# Patient Record
Sex: Female | Born: 1960 | Race: Black or African American | Hispanic: No | Marital: Married | Smoking: Never smoker
Health system: Southern US, Community
[De-identification: ages and names within clinical notes are randomized; demographics above are authoritative.]

## PROBLEM LIST (undated history)

## (undated) DIAGNOSIS — L97219 Non-pressure chronic ulcer of right calf with unspecified severity: Secondary | ICD-10-CM

## (undated) DIAGNOSIS — R079 Chest pain, unspecified: Secondary | ICD-10-CM

## (undated) DIAGNOSIS — E669 Obesity, unspecified: Secondary | ICD-10-CM

## (undated) DIAGNOSIS — I2510 Atherosclerotic heart disease of native coronary artery without angina pectoris: Secondary | ICD-10-CM

## (undated) DIAGNOSIS — R42 Dizziness and giddiness: Secondary | ICD-10-CM

## (undated) DIAGNOSIS — D751 Secondary polycythemia: Secondary | ICD-10-CM

## (undated) DIAGNOSIS — I10 Essential (primary) hypertension: Secondary | ICD-10-CM

## (undated) DIAGNOSIS — R0602 Shortness of breath: Secondary | ICD-10-CM

## (undated) DIAGNOSIS — R531 Weakness: Secondary | ICD-10-CM

## (undated) DIAGNOSIS — N6099 Unspecified benign mammary dysplasia of unspecified breast: Secondary | ICD-10-CM

## (undated) DIAGNOSIS — E119 Type 2 diabetes mellitus without complications: Secondary | ICD-10-CM

## (undated) DIAGNOSIS — E785 Hyperlipidemia, unspecified: Secondary | ICD-10-CM

## (undated) DIAGNOSIS — N183 Chronic kidney disease, stage 3 unspecified: Secondary | ICD-10-CM

## (undated) DIAGNOSIS — R000 Tachycardia, unspecified: Secondary | ICD-10-CM

## (undated) HISTORY — DX: Hyperlipidemia, unspecified: E78.5

## (undated) HISTORY — DX: Dizziness and giddiness: R42

## (undated) HISTORY — DX: Unspecified benign mammary dysplasia of unspecified breast: N60.99

## (undated) HISTORY — DX: Chest pain, unspecified: R07.9

## (undated) HISTORY — DX: Atherosclerotic heart disease of native coronary artery without angina pectoris: I25.10

## (undated) HISTORY — DX: Type 2 diabetes mellitus without complications: E11.9

## (undated) HISTORY — DX: Non-pressure chronic ulcer of right calf with unspecified severity: L97.219

## (undated) HISTORY — DX: Weakness: R53.1

## (undated) HISTORY — PX: ABDOMINAL HYSTERECTOMY: SHX81

## (undated) HISTORY — DX: Secondary polycythemia: D75.1

## (undated) HISTORY — DX: Chronic kidney disease, stage 3 unspecified: N18.30

## (undated) HISTORY — DX: Tachycardia, unspecified: R00.0

## (undated) HISTORY — DX: Shortness of breath: R06.02

## (undated) HISTORY — DX: Essential (primary) hypertension: I10

## (undated) HISTORY — DX: Obesity, unspecified: E66.9

---

## 2001-06-03 ENCOUNTER — Other Ambulatory Visit: Admission: RE | Admit: 2001-06-03 | Discharge: 2001-06-03 | Payer: Self-pay | Admitting: Internal Medicine

## 2007-03-03 ENCOUNTER — Observation Stay (HOSPITAL_COMMUNITY): Admission: EM | Admit: 2007-03-03 | Discharge: 2007-03-05 | Payer: Self-pay | Admitting: Emergency Medicine

## 2007-03-13 ENCOUNTER — Ambulatory Visit: Payer: Self-pay | Admitting: Gastroenterology

## 2007-04-01 ENCOUNTER — Encounter: Admission: RE | Admit: 2007-04-01 | Discharge: 2007-04-01 | Payer: Self-pay | Admitting: Internal Medicine

## 2007-05-26 ENCOUNTER — Ambulatory Visit (HOSPITAL_COMMUNITY): Admission: RE | Admit: 2007-05-26 | Discharge: 2007-05-27 | Payer: Self-pay | Admitting: Obstetrics and Gynecology

## 2007-05-26 ENCOUNTER — Encounter (INDEPENDENT_AMBULATORY_CARE_PROVIDER_SITE_OTHER): Payer: Self-pay | Admitting: Obstetrics and Gynecology

## 2007-06-10 ENCOUNTER — Inpatient Hospital Stay (HOSPITAL_COMMUNITY): Admission: AD | Admit: 2007-06-10 | Discharge: 2007-06-10 | Payer: Self-pay | Admitting: Obstetrics & Gynecology

## 2011-01-01 NOTE — Op Note (Signed)
Holly Spencer, Holly Spencer              ACCOUNT NO.:  [id number]   MEDICAL RECORD NO.:  [id number]          PATIENT TYPE:  INP   LOCATION:  9312                          FACILITY:  [facility]   PHYSICIAN:  Randye Lobo, M.D.   DATE OF BIRTH:  1961-07-30   DATE OF PROCEDURE:  05/26/2007  DATE OF DISCHARGE:                               OPERATIVE REPORT   PREOPERATIVE DIAGNOSIS:  Symptomatic uterine fibroids.   POSTOPERATIVE DIAGNOSIS:  Symptomatic uterine fibroids.  Minimal right  ovarian endometriosis.   PROCEDURES:  Total abdominal hysterectomy, fulguration of endometriosis  of the right ovary.   SURGEON:  Conley Simmonds, MD   ASSISTANT:  Lodema Hong, MD   ANESTHESIA:  General endotracheal.   IV FLUIDS:  2400 mL Ringer's lactate.   ESTIMATED BLOOD LOSS:  150 mL.   URINE OUTPUT:  500 mL.   COMPLICATIONS:  None.   INDICATIONS FOR PROCEDURE:  The patient is a 50 year old para 1 African  American female who presented for evaluation and treatment of  menorrhagia after she received a transfusion of 2 units of packed red  blood cells at an outside institution.  An ultrasound performed in the  office on April 15, 2007 documented multiple fibroids ranging in size  from 2.5 to 5.9 cm.  The right ovary was noted to be normal and the left  ovary was not visualized.  The patient did undergo an endometrial biopsy  which documented an endometrium with no evidence of hyperplasia or  malignancy.  The patient is a smoker and not a candidate for oral  contraceptive therapy.  She declines any future childbearing and she  wishes to proceed with definitive treatment of her uterine fibroids.  A  plan is now made to proceed with a total abdominal hysterectomy after  risks, benefits, and alternatives are reviewed.  The patient chooses to  proceed.   FINDINGS:  The uterus as noted to the 12-13 weeks' size with a left  fundal and a posterior lower uterine segment fibroid each measuring  approximately 5  cm in diameter.  The right ovary contained a 4 mm area  of dark brown endometriosis.  The fallopian tubes were unremarkable as  was the left ovary.  The appendix was visualized and was noted to be  normal.  In the upper abdomen the liver and gallbladder, kidneys and  periaortic regions were all normal.  There was no evidence of any  adhesive disease in the abdomen or the pelvis.   SPECIMENS:  The uterus and cervix were sent to pathology.   PROCEDURE:  The patient was reidentified in the preoperative hold area.  She did receive Ancef 1 gram IV for antibiotic prophylaxis.  She  received both TED hose and PAS stockings for DVT prophylaxis.   In the operating room, general endotracheal anesthesia was induced and  the patient was then placed in the dorsal lithotomy position with Allen  stirrups.  An examination under anesthesia was performed.  The uterus  was noted to be mobile.   The abdomen and the vagina were then sterilely prepped and a Foley  catheter  was placed inside the bladder.  She was sterilely draped.   The procedure began with a Pfannenstiel incision which was created with  a scalpel.  Incision was carried down to the fascia using monopolar  cautery for hemostasis.  The fascia was then incised in the midline with  the monopolar cautery.  The fascial incision was extended bilaterally  with a Mayo scissors.  The rectus muscles were dissected off of the  fascia superiorly and inferiorly.  A diastasis recti was encountered.  The parietal peritoneum was elevated with the hemostat clamps and  entered sharply with the Metzenbaum scissors.  The incision was extended  cranially and caudally.  Examination of the pelvis and abdomen were  performed.  The findings are as noted above.  The patient was placed in  the Trendelenburg position and a self-retaining retractor was placed in  the pelvis.  The bowel was packed into the upper abdomen using moistened  lap pads.   The hysterectomy  began by placing long Kelly clamps across the adnexal  structures bilaterally.  The left round ligament was isolated and was  suture ligated with a transfixing suture of 0 Vicryl.  It was then  divided using monopolar cautery.  The broad ligament was then opened  cranially and caudally and a window was created to the posterior leaf of  the broad ligament.  The proximal fallopian tube and the utero-ovarian  ligaments were then clamped, sharply divided, and suture ligated with a  free tie of 0 Vicryl followed by suture ligature of the same.  The  bladder was dissected off of the lower uterine segment on the  ipsilateral side.  The same procedure that was performed on the left-  hand side was then repeated on the right-hand side.  The uterine  arteries were then skeletonized bilaterally and they were each clamped,  sharply divided, and suture ligated with 0 Vicryl.  The bladder was  dissected further onto the cervix, and a second clamp was then placed on  the lower branches of the uterine arteries.  These were divided, and  again suture-ligated with 0 Vicryl.  At this time the uterine fundus was  amputated using a scalpel and the specimen was set aside.  The remaining  aspects of the all cardinal ligaments were clamped, sharply divided, and  suture ligated with 0 Vicryl.  The uterosacral ligaments were clamped,  sharply divided, and suture ligated with transfixing sutures of 0  Vicryl.  The vagina was entered at this time and the cervix was  circumscribed from the surrounding vagina.  The cervix was then sent to  pathology along with the uterus.  Modified Richardson angle sutures were  created bilaterally and the vaginal cuff was closed by placing figure-of-  eight sutures of 0 Vicryl.   The pelvis was then placed irrigated with crystalloid solution and there  was some bleeding noted to appear along the midportion of the vaginal  cuff.  A figure-of-eight suture of 0 Vicryl was placed to  create good  hemostasis.   At this time the adnexal pedicles were suture ligated to the round  ligaments on each ipsilateral side.   The right ovary was fulgurated of the endometriosis using the monopolar  instrument.   The self-retaining retractor and the lap pads were removed.  The abdomen  was closed.  The parietal peritoneum was closed with a running suture of  2-0 Vicryl.  The rectus muscles were then reapproximated in the midline  inferiorly by  placing interrupted sutures of 0 Vicryl.  The fascia was  closed with a running suture of 0 Vicryl.  The subcutaneous layer was  irrigated and suctioned and made hemostatic with monopolar cautery.  The  skin was closed with staples.  A sterile bandage was placed over the  incision after the patient was cleansed of any remaining Betadine.   This concluded the patient's procedure.  There were no complications.  All needle, instrument, sponge counts were correct.  The patient is  escorted to the recovery room in stable and awake condition.      Randye Lobo, M.D.  Electronically Signed     BES/MEDQ  D:  05/26/2007  T:  05/26/2007  Job:  [id number]

## 2011-01-01 NOTE — Discharge Summary (Signed)
Holly Spencer, Holly Spencer              ACCOUNT NO.:  [id number]   MEDICAL RECORD NO.:  [id number]          PATIENT TYPE:  OBV   LOCATION:  5022                         FACILITY:  [facility]   PHYSICIAN:  Altha Harm, MDDATE OF BIRTH:  [birth year]   DATE OF ADMISSION:  03/02/2007  DATE OF DISCHARGE:  03/05/2007                               DISCHARGE SUMMARY   DISCHARGE DISPOSITION:  Home.   FINAL DISCHARGE DIAGNOSES:  1. Iron deficiency anemia.  2. Menorrhagia.  3. Newly diagnosed diabetes type 2.  4. Gastroenteritis, resolved.  5. Obesity.   DISCHARGE MEDICATIONS:  1. Glucophage 500 mg p.o. b.i.d.  2. Iron sulfate 325 mg p.o. t.i.d.   CONSULTING PHYSICIAN:  [facility].   PROCEDURE:  Transfusion of two units packed red blood cells.   DIAGNOSTIC STUDIES:  None.   PERTINENT LABORATORY DATA:  At time of discharge, the patient had a  white blood cell count of 12.0, a hemoglobin prior to transfusion of  6.9, hematocrit of 23.3, and a platelet count of 502,000.  Patient had  iron level of less than 10 and a ferritin level of 38.  Patient had a  hemoglobin electrophoresis done which showed hemoglobin-H of 1.3 and a  hemoglobin-A of 98.7.  Patient had no evidence of hemoglobin-S or any  other aberrant hemoglobins.   CODE STATUS:  Full code.   ALLERGIES:  NO KNOWN DRUG ALLERGIES.   PRIMARY CARE PHYSICIAN:  Dr. Andi Devon.   CHIEF COMPLAINT:  Nausea, vomiting and diarrhea.   HISTORY OF PRESENT ILLNESS:  Please see H&P dictated by Dr. Lavera Guise for  details of the HPI.   HOSPITAL COURSE:  1. Gastroenteritis:  The patient was found to have nausea and vomiting      and diarrhea.  She was given bowel rest and her symptoms resolved.      The patient had food regimens starting with clear liquids and      advancing to a diet which she tolerated without any difficulty.  2. Iron deficiency anemia:  The patient was found to have a profoundly      low hemoglobin of 6.9  with only minimal symptomatologies of      occasional dizziness and some tiredness.  The patient gave a      history consistent with menorrhagia and heavy menses over a long      period of time which probably is the most reasonable cause for her      anemia.  The patient declined GYN evaluation while hospitalized and      stated that she would like to follow-up as an outpatient to choose      a GYN of her own choice.  Ast the patient was essentially stable,      the patient was transfused two units of packed red blood cells and      should follow-up with a GYN as an outpatient for a full GYN      examination and possible hormonal therapy to treat the menorrhagia      leading to this profound anemia.  The patient has been  transfused      two units of packed red blood cells and H&H checked after the      transfusion.  The patient's primary care physician should follow-up      with her H&H for a baseline post-transfusion.  I would recommend      that Miss Weisbecker follows up with Dr. Renae Gloss in the next week.      She has been placed on iron 325 mg p.o. t.i.d. and of note, the      patient did receive iron infusions while hospitalized.  3. Diabetes type 2, newly diagnosed:  The patient was found to have a      hemoglobin A1C mildly elevated at 6.6.  This was investigated after      the patient was found to have fasting blood glucoses greater than      126.  The patient does have a history of diabetes in her family and      is probably at the beginning stages of diabetes type 2, which might      have been contributory to her nausea and vomiting.  The patient has      been given diabetic instructions on diet and a comprehensive video      dealing with diabetic education.  The patient has been started on      Glucophage 500 mg p.o. b.i.d. and should follow-up with her primary      care physician for further titration of medications.  I would      recommend that the patient have a fasting  cholesterol done as an      outpatient for further risk stratification and also have an      ophthalmologic examination to be done as an outpatient.  The      patient needs to follow-up with her primary care physician for      further titration of her medications and monitoring of her renal      function.   CONDITION ON DISCHARGE:  The patient's condition at the time of  discharge is stable.  Vital signs are stable.  As a matter of fact, the  patient has a blood pressure of 124/72 and a heart rate of 74.  She is  afebrile and she is 98 to 100% on room air.   DISCHARGE DIET:  The patient should be on a diabetic, low-cholesterol  diet until fasting cholesterol is done and the patient may also benefit  from a glucose tolerance test.      Altha Harm, MD  Electronically Signed     MAM/MEDQ  D:  03/05/2007  T:  03/06/2007  Job:  [id number]   cc:   Merlene Laughter. Renae Gloss, M.D.

## 2011-01-01 NOTE — H&P (Signed)
NAMETHI, KLICH              ACCOUNT NO.:  [id number]   MEDICAL RECORD NO.:  [id number]          PATIENT TYPE:  OBV   LOCATION:  5022                         FACILITY:  [facility]   PHYSICIAN:  Lonia Blood, M.D.       DATE OF BIRTH:  1960-11-17   DATE OF ADMISSION:  03/02/2007  DATE OF DISCHARGE:                              HISTORY & PHYSICAL   PRIMARY CARE PHYSICIAN:  Cala Bradford R. Renae Gloss, M.D.   CHIEF COMPLAINT:  Nausea, vomiting and diarrhea.   HISTORY OF PRESENT ILLNESS:  Mrs. Mccrory is a 50 year old woman  without any significant past medical history.  She woke up on the  morning of admission with severe nausea.  She vomited a few times and  then had profuse diarrhea.  The patient was unable to take anything by  mouth.  She got extremely weak and dehydrated and presented to the  emergency room.   PAST MEDICAL HISTORY:  None.   HOME MEDICATIONS:  None.   SOCIAL HISTORY:  The patient works at [employer].  She does  not smoke cigarettes.  She does not drink alcohol.  She is married and  has one child.   REVIEW OF SYSTEMS:  As per HPI.  Also the patient denies chest pain.  Denies any coughing, denies shortness of breath, denies headaches.  No  focal weakness or numbness.  No urinary complaints.  No skin changes.   PHYSICAL EXAMINATION:  VITAL SIGNS:  Upon admission shows a temperature  of 97, heart rate of 72, blood pressure 164/79, saturations 100% on room  air.  GENERAL APPEARANCE:  An obese woman, alert and oriented to person, place  and time.  HEENT:  Head appears normocephalic and atraumatic.  Eyes:  Pupils equal,  round, reactive to light and accommodation.  Extraocular movements  intact.  Throat is clear.  NECK:  Supple.  No jugular venous distention or carotid bruits.  CHEST:  Clear to auscultation bilaterally without wheezes, rhonchi or  crackles.  HEART:  Regular rate and rhythm without murmurs, rubs or gallops.  ABDOMEN:  Obese, soft.  There is  diffuse tenderness in the epigastric  region.  LOWER EXTREMITIES:  Have no edema.  SKIN:  Warm and dry without abrasions, rashes.   LABORATORY DATA:  At the time of admission, white blood cell count is  16,000, hemoglobin is 7, MCV 61, neutrophil count is 14.4.   ASSESSMENT/PLAN:  Acute gastroenteritis.  The patient will be admitted  for observation at [facility].  She will have stool cultures  and basic metabolic profile to rule out dehydration.  Empiric  antibiotics will be started.  Intravenous fluids will be given.  Symptomatic treatment will be given.  The patient ate the night prior to admission at the [employer]  which raises the possibility of food poisoning.  If the stool cultures  return positive any pathogens the case will be reported to the Health  Department.      Lonia Blood, M.D.  Electronically Signed     SL/MEDQ  D:  03/03/2007  T:  03/03/2007  Job:  [id number] [id number]

## 2011-05-29 LAB — COMPREHENSIVE METABOLIC PANEL
AST: 24
Albumin: 3.9
Chloride: 102
Creatinine, Ser: 0.75
GFR calc Af Amer: >60
Total Bilirubin: 1
Total Protein: 8

## 2011-05-29 LAB — URINALYSIS, ROUTINE W REFLEX MICROSCOPIC
Bilirubin Urine: NEGATIVE
Glucose, UA: 100 — AB
Specific Gravity, Urine: 1.02
pH: 8.5 — ABNORMAL HIGH

## 2011-05-29 LAB — URINE MICROSCOPIC-ADD ON

## 2011-05-29 LAB — CBC
MCV: 89.2
Platelets: 395
RDW: 15.3 — ABNORMAL HIGH
WBC: 12 — ABNORMAL HIGH

## 2011-05-30 LAB — CBC
Hemoglobin: 9.3 — ABNORMAL LOW
MCHC: 34.3
Platelets: 483 — ABNORMAL HIGH
RBC: 3.03 — ABNORMAL LOW
RDW: 14.7 — ABNORMAL HIGH
WBC: 10.5

## 2011-05-30 LAB — COMPREHENSIVE METABOLIC PANEL
ALT: 17
AST: 17
Alkaline Phosphatase: 76
CO2: 26
Calcium: 8.6
GFR calc Af Amer: >60
GFR calc non Af Amer: >60
Glucose, Bld: 110 — ABNORMAL HIGH
Potassium: 3.9
Sodium: 134 — ABNORMAL LOW

## 2011-05-30 LAB — BASIC METABOLIC PANEL
CO2: 28
Calcium: 8.6
Creatinine, Ser: 0.68
GFR calc non Af Amer: >60
Glucose, Bld: 149 — ABNORMAL HIGH

## 2011-05-30 LAB — URINALYSIS, ROUTINE W REFLEX MICROSCOPIC
Glucose, UA: NEGATIVE
Ketones, ur: NEGATIVE
Leukocytes, UA: NEGATIVE
Protein, ur: NEGATIVE
pH: 5.5

## 2011-05-30 LAB — URINE MICROSCOPIC-ADD ON

## 2011-05-30 LAB — PROTIME-INR: Prothrombin Time: 13.2

## 2011-06-03 LAB — CROSSMATCH: ABO/RH(D): A POS

## 2011-06-03 LAB — CBC
HCT: 21.6 — ABNORMAL LOW
HCT: 22 — ABNORMAL LOW
HCT: 23.3 — ABNORMAL LOW
HCT: 32 — ABNORMAL LOW
Hemoglobin: 6.4 — CL
Hemoglobin: 6.4 — CL
Hemoglobin: 9.7 — ABNORMAL LOW
MCHC: 29.6 — ABNORMAL LOW
MCHC: 30.4
MCV: 62.5 — ABNORMAL LOW
MCV: 62.6 — ABNORMAL LOW
MCV: 69.6 — ABNORMAL LOW
Platelets: 178
Platelets: 494 — ABNORMAL HIGH
Platelets: 502 — ABNORMAL HIGH
RBC: 3.45 — ABNORMAL LOW
RBC: 3.47 — ABNORMAL LOW
RBC: 3.75 — ABNORMAL LOW
RBC: 4.61
RDW: 19.5 — ABNORMAL HIGH
WBC: 11.5 — ABNORMAL HIGH
WBC: 14.7 — ABNORMAL HIGH
WBC: 16.5 — ABNORMAL HIGH
WBC: 7.8

## 2011-06-03 LAB — COMPREHENSIVE METABOLIC PANEL
Alkaline Phosphatase: 93
BUN: 9
Calcium: 9.3
GFR calc non Af Amer: >60
Glucose, Bld: 139 — ABNORMAL HIGH
Total Protein: 6.9

## 2011-06-03 LAB — HEMOGLOBIN A1C: Hgb A1c MFr Bld: 6.6 — ABNORMAL HIGH

## 2011-06-03 LAB — DIFFERENTIAL
Basophils Absolute: 0.1
Basophils Relative: 0
Eosinophils Absolute: 0.1
Lymphocytes Relative: 17
Lymphs Abs: 0.9
Lymphs Abs: 2
Monocytes Relative: 2 — ABNORMAL LOW
Monocytes Relative: 9
Neutro Abs: 6.7
Neutrophils Relative %: 86 — ABNORMAL HIGH

## 2011-06-03 LAB — HEMOGLOBINOPATHY EVALUATION: Hemoglobin Other: 0 (ref 0.0–0.0)

## 2011-06-03 LAB — FOLATE RBC: RBC Folate: 1486 — ABNORMAL HIGH

## 2011-06-03 LAB — BASIC METABOLIC PANEL
BUN: 8
Chloride: 100
GFR calc non Af Amer: >60
Potassium: 3.2 — ABNORMAL LOW
Sodium: 135

## 2011-06-03 LAB — RETICULOCYTES
Retic Count, Absolute: 69.8
Retic Count, Absolute: 80.5

## 2011-06-03 LAB — IRON AND TIBC
Iron: <10 — ABNORMAL LOW
UIBC: 555

## 2011-06-03 LAB — FOLATE: Folate: >20

## 2011-06-03 LAB — IRON: Iron: 682 — ABNORMAL HIGH

## 2011-06-03 LAB — VITAMIN B12: Vitamin B-12: 489 (ref 211–911)

## 2011-09-21 ENCOUNTER — Ambulatory Visit: Payer: Managed Care, Other (non HMO) | Admitting: Family Medicine

## 2011-09-21 VITALS — BP 168/112 | HR 71 | Temp 98.9°F | Resp 17

## 2011-09-21 DIAGNOSIS — I10 Essential (primary) hypertension: Secondary | ICD-10-CM

## 2011-09-21 DIAGNOSIS — R05 Cough: Secondary | ICD-10-CM

## 2011-09-21 DIAGNOSIS — E119 Type 2 diabetes mellitus without complications: Secondary | ICD-10-CM

## 2011-09-21 DIAGNOSIS — R059 Cough, unspecified: Secondary | ICD-10-CM

## 2011-09-21 DIAGNOSIS — J209 Acute bronchitis, unspecified: Secondary | ICD-10-CM

## 2011-09-21 MED ORDER — METFORMIN HCL ER 750 MG PO TB24: 750.0000 mg | ORAL_TABLET | ORAL | Status: DC

## 2011-09-21 MED ORDER — VALSARTAN-HYDROCHLOROTHIAZIDE 320-12.5 MG PO TABS: 1.0000 | ORAL_TABLET | Freq: Every day | ORAL | Status: DC

## 2011-09-21 MED ORDER — AZITHROMYCIN 250 MG PO TABS: ORAL_TABLET | ORAL | Status: AC

## 2011-09-21 MED ORDER — HYDROCODONE-HOMATROPINE 5-1.5 MG/5ML PO SYRP
5.0000 mL | ORAL_SOLUTION | Freq: Four times a day (QID) | ORAL | Status: AC | PRN
Start: 2011-09-21 — End: 2011-10-01

## 2011-09-21 NOTE — Patient Instructions (Signed)

## 2011-09-21 NOTE — Progress Notes (Signed)
  Subjective:    Patient ID: Holly Spencer, female    DOB: May 16, 1961, 51 y.o.   MRN: [id number]  Cough This is a new problem. The current episode started 1 to 4 weeks ago. The problem has been gradually worsening. The problem occurs constantly. The cough is productive of purulent sputum. Associated symptoms include nasal congestion, postnasal drip, rhinorrhea and shortness of breath. Pertinent negatives include no chest pain, chills, ear congestion, ear pain, fever, headaches, heartburn, hemoptysis, myalgias, rash, sore throat, sweats, weight loss or wheezing. Risk factors for lung disease include occupational exposure. She has tried OTC cough suppressant for the symptoms. The treatment provided no relief. There is no history of asthma or COPD.  Shortness of Breath Associated symptoms include rhinorrhea. Pertinent negatives include no chest pain, ear pain, fever, headaches, hemoptysis, rash, sore throat or wheezing. There is no history of asthma or COPD.      Review of Systems  Constitutional: Negative for fever, chills and weight loss.  HENT: Positive for rhinorrhea and postnasal drip. Negative for ear pain and sore throat.   Respiratory: Positive for cough and shortness of breath. Negative for hemoptysis and wheezing.   Cardiovascular: Negative for chest pain.  Gastrointestinal: Negative for heartburn.  Musculoskeletal: Negative for myalgias.  Skin: Negative for rash.  Neurological: Negative for headaches.       Objective:   Physical Exam  Constitutional: She is oriented to person, place, and time. She appears well-developed and well-nourished.  HENT:  Head: Normocephalic and atraumatic.  Right Ear: External ear normal.  Left Ear: External ear normal.  Mouth/Throat: Oropharynx is clear and moist.  Eyes: Conjunctivae are normal. Pupils are equal, round, and reactive to light.  Neck: Normal range of motion. Neck supple.  Cardiovascular: Normal rate and normal heart sounds.     Pulmonary/Chest: Effort normal. No respiratory distress. She has wheezes. She exhibits no tenderness.  Neurological: She is alert and oriented to person, place, and time. A cranial nerve deficit is present.          Assessment & Plan:  Elevated BP out of meds Diabetes out of meds Bronchitis

## 2011-12-02 ENCOUNTER — Ambulatory Visit: Payer: Managed Care, Other (non HMO) | Admitting: Family Medicine

## 2011-12-02 VITALS — BP 140/100 | HR 88 | Temp 98.4°F | Resp 18 | Ht 64.0 in | Wt 244.4 lb

## 2011-12-02 DIAGNOSIS — S39012A Strain of muscle, fascia and tendon of lower back, initial encounter: Secondary | ICD-10-CM

## 2011-12-02 DIAGNOSIS — M25519 Pain in unspecified shoulder: Secondary | ICD-10-CM

## 2011-12-02 DIAGNOSIS — IMO0002 Reserved for concepts with insufficient information to code with codable children: Secondary | ICD-10-CM

## 2011-12-02 DIAGNOSIS — E669 Obesity, unspecified: Secondary | ICD-10-CM

## 2011-12-02 DIAGNOSIS — M1710 Unilateral primary osteoarthritis, unspecified knee: Secondary | ICD-10-CM

## 2011-12-02 DIAGNOSIS — M25569 Pain in unspecified knee: Secondary | ICD-10-CM

## 2011-12-02 DIAGNOSIS — M25529 Pain in unspecified elbow: Secondary | ICD-10-CM

## 2011-12-02 MED ORDER — OXAPROZIN 600 MG PO TABS: 600.0000 mg | ORAL_TABLET | Freq: Two times a day (BID) | ORAL | Status: AC

## 2011-12-02 MED ORDER — METHOCARBAMOL 750 MG PO TABS: ORAL_TABLET | ORAL | Status: DC

## 2011-12-02 NOTE — Patient Instructions (Signed)
Take medications as ordered.  If a specific area gets painful it may help be helped by either icing or heat.  If worse return at any time

## 2011-12-02 NOTE — Progress Notes (Signed)
Subjective: Patient was carrying trash down the slightly wet wooden stairs this morning when she slipped and fell about 5 steps or so. She tried to grab the to stabilize herself with her left arm and neck her body around. She scraped her left forearm and injured her left elbow right shoulder low back right leg. She just yanked around her quite hard.  Sh major bruises are noted. e had no head injury no loss of consciousness. She has continued to hurt more as the day went on and decided she needed to get checked.  Objective: Overweight lady in no major distress at this time. He has full range of motion of her arms and normal gait. Her right shoulder is tender, mostly anteriorly and laterally. Her left elbow and forearm are tender. There is small abrasion of the left forearm. The low back is hurting some, but has fair range of motion. There right knee is tender. There is crepitance of both knees, probably little worse of the right than the left, but that does not seem to be acute. Ankle and foot are tender. No major bruises are noted.  Assessment: Multiple contusions and strains of shoulder, elbow, forearm, back, and right leg. Obesity DJD knees  Plan: Muscle relaxant and anti-inflammatory medication. Advised long term care of her knees by losing weight.

## 2011-12-16 ENCOUNTER — Ambulatory Visit: Payer: Managed Care, Other (non HMO) | Admitting: Family Medicine

## 2011-12-16 VITALS — BP 190/100 | HR 80 | Temp 98.0°F | Resp 18 | Ht 70.0 in | Wt 252.6 lb

## 2011-12-16 DIAGNOSIS — W57XXXA Bitten or stung by nonvenomous insect and other nonvenomous arthropods, initial encounter: Secondary | ICD-10-CM

## 2011-12-16 MED ORDER — DOXYCYCLINE HYCLATE 50 MG PO CAPS: ORAL_CAPSULE | ORAL | Status: DC

## 2011-12-16 NOTE — Progress Notes (Signed)
  Subjective:    Patient ID: Holly Spencer, female    DOB: [birth year], 51 y.o.   MRN: [id number]  HPI 51 yo female here about a tick bite. Found tick on her this morning.  Husband pulled off, but thinks head left in.  Was outside on Saturday.  Doesn't remember tick there Sunday, so thinks it happened yesterday.  States was still small.  Bled when it came off.  Here because her mother has been worrying her about lyme disease and spotted fever if the head was left in.   Increased BP - hasn't taken meds.    Review of Systems Negative except as per HPI     Objective:   Physical Exam  Constitutional: She appears well-developed.  Pulmonary/Chest: Effort normal.  Neurological: She is alert.   Small, erythematous, indurated area on right upper thigh, near inguinal regioin       Assessment & Plan:  Tick bite - possibly on 48 hours.  Prophylaxis with doxy 200mg  x 1 dose given.   HTN - advised to take meds ASAP today.

## 2012-05-09 ENCOUNTER — Ambulatory Visit: Payer: Managed Care, Other (non HMO)

## 2012-05-22 ENCOUNTER — Ambulatory Visit: Payer: Managed Care, Other (non HMO) | Admitting: Internal Medicine

## 2012-05-22 ENCOUNTER — Ambulatory Visit: Payer: Managed Care, Other (non HMO)

## 2012-05-22 VITALS — BP 132/64 | HR 112 | Temp 97.6°F | Resp 16 | Ht 65.5 in | Wt 217.8 lb

## 2012-05-22 DIAGNOSIS — J329 Chronic sinusitis, unspecified: Secondary | ICD-10-CM

## 2012-05-22 DIAGNOSIS — Z7189 Other specified counseling: Secondary | ICD-10-CM

## 2012-05-22 DIAGNOSIS — R739 Hyperglycemia, unspecified: Secondary | ICD-10-CM

## 2012-05-22 DIAGNOSIS — R32 Unspecified urinary incontinence: Secondary | ICD-10-CM

## 2012-05-22 DIAGNOSIS — I10 Essential (primary) hypertension: Secondary | ICD-10-CM

## 2012-05-22 DIAGNOSIS — E119 Type 2 diabetes mellitus without complications: Secondary | ICD-10-CM

## 2012-05-22 LAB — POCT UA - MICROSCOPIC ONLY
Casts, Ur, LPF, POC: NEGATIVE
Mucus, UA: NEGATIVE
Yeast, UA: NEGATIVE

## 2012-05-22 LAB — POCT GLYCOSYLATED HEMOGLOBIN (HGB A1C): Hemoglobin A1C: 14

## 2012-05-22 LAB — COMPREHENSIVE METABOLIC PANEL
ALT: 46 U/L — ABNORMAL HIGH (ref 0–35)
AST: 38 U/L — ABNORMAL HIGH (ref 0–37)
CO2: 21 mEq/L (ref 19–32)
Chloride: 93 mEq/L — ABNORMAL LOW (ref 96–112)
Creat: 1.08 mg/dL (ref 0.50–1.10)
Sodium: 128 mEq/L — ABNORMAL LOW (ref 135–145)
Total Bilirubin: 0.5 mg/dL (ref 0.3–1.2)
Total Protein: 7.1 g/dL (ref 6.0–8.3)

## 2012-05-22 LAB — POCT URINALYSIS DIPSTICK
Ketones, UA: 15
Leukocytes, UA: NEGATIVE
Protein, UA: NEGATIVE
Urobilinogen, UA: 0.2
pH, UA: 5

## 2012-05-22 LAB — POCT CBC
Granulocyte percent: 69.2 %G (ref 37–80)
Hemoglobin: 14.2 g/dL (ref 12.2–16.2)
MID (cbc): 0.6 (ref 0–0.9)
MPV: 9.4 fL (ref 0–99.8)
POC Granulocyte: 6.9 (ref 2–6.9)
POC MID %: 5.7 %M (ref 0–12)
Platelet Count, POC: 431 10*3/uL — AB (ref 142–424)
RBC: 4.71 M/uL (ref 4.04–5.48)
WBC: 9.9 10*3/uL (ref 4.6–10.2)

## 2012-05-22 LAB — GLUCOSE, POCT (MANUAL RESULT ENTRY)

## 2012-05-22 MED ORDER — GLIPIZIDE 5 MG PO TABS: 5.0000 mg | ORAL_TABLET | Freq: Two times a day (BID) | ORAL | Status: DC

## 2012-05-22 MED ORDER — INSULIN ASPART 100 UNIT/ML ~~LOC~~ SOLN
16.0000 [IU] | Freq: Once | SUBCUTANEOUS | Status: AC
  Administered 2012-05-22: 16 [IU] via SUBCUTANEOUS

## 2012-05-22 NOTE — Progress Notes (Signed)
Subjective:    Patient ID: Holly Spencer, female    DOB: 1961/05/02, 51 y.o.   MRN: [id number]  HPI NIDDM,HTN  C/o fatigue sweating and feeling terrible. Does not f/up as ordered by her doctors. Has not seen her primary care over 9 months. Does not do her home dm monitoring as she was taught, states too busy taking care of her family to take care of herself. No chest pain or pressure, no hx of heart disease.   Review of Systems     Objective:   Physical Exam  Constitutional: She is oriented to person, place, and time. She appears well-nourished. No distress.  HENT:  Mouth/Throat: Oropharynx is clear and moist.  Eyes: EOM are normal.  Cardiovascular: Regular rhythm.  Tachycardia present.  Exam reveals gallop.   Pulmonary/Chest: Effort normal and breath sounds normal.  Neurological: She is alert and oriented to person, place, and time.  Skin: Skin is warm and dry.  Psychiatric: She has a normal mood and affect.   EKG sinus tachy, enlarged atria [facility] reading (PRIMARY) by  Dr.Guest clear cxr Results for orders placed in visit on 05/22/12  POCT CBC      Component Value Range   WBC 9.9  4.6 - 10.2 K/uL   Lymph, poc 2.5  0.6 - 3.4   POC LYMPH PERCENT 25.1  10 - 50 %L   MID (cbc) 0.6  0 - 0.9   POC MID % 5.7  0 - 12 %M   POC Granulocyte 6.9  2 - 6.9   Granulocyte percent 69.2  37 - 80 %G   RBC 4.71  4.04 - 5.48 M/uL   Hemoglobin 14.2  12.2 - 16.2 g/dL   HCT, POC 40.9  81.1 - 47.9 %   MCV 96.2  80 - 97 fL   MCH, POC 30.1  27 - 31.2 pg   MCHC 31.3 (*) 31.8 - 35.4 g/dL   RDW, POC 91.4     Platelet Count, POC 431 (*) 142 - 424 K/uL   MPV 9.4  0 - 99.8 fL  GLUCOSE, POCT (MANUAL RESULT ENTRY)      Component Value Range   POC Glucose HHH  70 - 99 mg/dl  POCT GLYCOSYLATED HEMOGLOBIN (HGB A1C)      Component Value Range   Hemoglobin A1C 14.0    POCT URINALYSIS DIPSTICK      Component Value Range   Color, UA yellow     Clarity, UA clear     Glucose, UA 500     Bilirubin, UA  negative     Ketones, UA 15     Spec Grav, UA <=1.005     Blood, UA trace     pH, UA 5.0     Protein, UA negative     Urobilinogen, UA 0.2     Nitrite, UA negative     Leukocytes, UA Negative    POCT UA - MICROSCOPIC ONLY      Component Value Range   WBC, Ur, HPF, POC 0-1     RBC, urine, microscopic 2-3     Bacteria, U Microscopic trace     Mucus, UA negative     Epithelial cells, urine per micros 0-1     Crystals, Ur, HPF, POC negative     Casts, Ur, LPF, POC negative     Yeast, UA negative     .         Assessment & Plan:  NIDDM uncontrolled give  novolog 16 units sq now HTN See Dr. Renae Gloss next week asap/ schedule today Reck progress in am here Saturday

## 2012-05-22 NOTE — Patient Instructions (Signed)

## 2012-05-24 ENCOUNTER — Telehealth: Payer: Self-pay

## 2012-05-24 NOTE — Telephone Encounter (Signed)
Pt needs a rx for a glucose meter and supplies so that she can start checking her b.s at home.

## 2012-05-25 MED ORDER — BLOOD GLUCOSE METER KIT: PACK | Status: AC

## 2012-05-25 MED ORDER — FINGERSTIX LANCETS MISC: Status: DC

## 2012-05-25 NOTE — Telephone Encounter (Signed)
Yes, it is ok to send a meter and supplies.

## 2012-05-25 NOTE — Telephone Encounter (Signed)
She was supposed to come here on Saturday and did not come in as advised. She is not compliant with treatment. She is asking for meter, please advise.

## 2012-05-25 NOTE — Telephone Encounter (Signed)
Sent/ have sent  To Maralyn Sago to sign, Marylene Land has gone home

## 2012-05-29 ENCOUNTER — Telehealth: Payer: Self-pay

## 2012-05-29 NOTE — Telephone Encounter (Signed)
Can we refer? 

## 2012-05-29 NOTE — Telephone Encounter (Signed)
PATIENT CALLED IN REGARDS ASKING FOR A REFERRAL FOR A DIABETES SPECIALIST. PATIENT IS FRUSTRATED AND NEEDS HELP GETTING A REFERRAL. THANK YOU!

## 2012-05-29 NOTE — Telephone Encounter (Signed)
Will be happy to set this referral up for her. Referral made.

## 2012-07-07 ENCOUNTER — Encounter: Payer: Managed Care, Other (non HMO) | Attending: Physician Assistant | Admitting: *Deleted

## 2012-07-07 VITALS — Ht 66.0 in | Wt 220.8 lb

## 2012-07-07 DIAGNOSIS — E119 Type 2 diabetes mellitus without complications: Secondary | ICD-10-CM | POA: Insufficient documentation

## 2012-07-07 DIAGNOSIS — Z713 Dietary counseling and surveillance: Secondary | ICD-10-CM | POA: Insufficient documentation

## 2012-07-14 ENCOUNTER — Encounter: Payer: Self-pay | Admitting: *Deleted

## 2012-07-14 NOTE — Progress Notes (Signed)
  Patient was seen on 07/07/2012 for the first of a series of three diabetes self-management courses at the [facility]. A1c on 05-22-12 = 14% The following learning objectives were met by the patient during this course:   Defines the role of glucose and insulin  Identifies type of diabetes and pathophysiology  Defines the diagnostic criteria for diabetes and prediabetes  States the risk factors for Type 2 Diabetes  States the symptoms of Type 2 Diabetes  Defines Type 2 Diabetes treatment goals  Defines Type 2 Diabetes treatment options  States the rationale for glucose monitoring  Identifies A1C, glucose targets, and testing times  Identifies proper sharps disposal  Defines the purpose of a diabetes food plan  Identifies carbohydrate food groups  Defines effects of carbohydrate foods on glucose levels  Identifies carbohydrate choices/grams/food labels  States benefits of physical activity and effect on glucose  Review of suggested activity guidelines  Handouts given during class include:  Type 2 Diabetes: Basics Book  My Food Plan Book  Food and Activity Log  Follow-Up Plan: Core Class 2

## 2012-07-28 ENCOUNTER — Ambulatory Visit: Payer: Managed Care, Other (non HMO)

## 2012-09-21 ENCOUNTER — Other Ambulatory Visit: Payer: Self-pay | Admitting: Family Medicine

## 2012-09-23 ENCOUNTER — Other Ambulatory Visit: Payer: Self-pay | Admitting: Family Medicine

## 2012-12-14 ENCOUNTER — Other Ambulatory Visit: Payer: Self-pay

## 2012-12-14 DIAGNOSIS — Z1231 Encounter for screening mammogram for malignant neoplasm of breast: Secondary | ICD-10-CM

## 2012-12-17 ENCOUNTER — Ambulatory Visit: Payer: Managed Care, Other (non HMO)

## 2013-08-07 ENCOUNTER — Ambulatory Visit: Payer: Managed Care, Other (non HMO) | Admitting: Family Medicine

## 2013-08-07 VITALS — BP 148/92 | HR 97 | Temp 99.4°F | Resp 18 | Ht 64.0 in | Wt 205.0 lb

## 2013-08-07 DIAGNOSIS — R739 Hyperglycemia, unspecified: Secondary | ICD-10-CM

## 2013-08-07 DIAGNOSIS — L02214 Cutaneous abscess of groin: Secondary | ICD-10-CM

## 2013-08-07 DIAGNOSIS — L02219 Cutaneous abscess of trunk, unspecified: Secondary | ICD-10-CM

## 2013-08-07 DIAGNOSIS — I10 Essential (primary) hypertension: Secondary | ICD-10-CM

## 2013-08-07 DIAGNOSIS — E119 Type 2 diabetes mellitus without complications: Secondary | ICD-10-CM

## 2013-08-07 DIAGNOSIS — R7309 Other abnormal glucose: Secondary | ICD-10-CM

## 2013-08-07 LAB — LIPID PANEL
Cholesterol: 226 mg/dL — ABNORMAL HIGH (ref 0–200)
HDL: 53 mg/dL (ref >39)
LDL Cholesterol: 144 mg/dL — ABNORMAL HIGH (ref 0–99)
Total CHOL/HDL Ratio: 4.3 Ratio
Triglycerides: 144 mg/dL (ref <150)
VLDL: 29 mg/dL (ref 0–40)

## 2013-08-07 LAB — POCT CBC
Granulocyte percent: 78.6 %G (ref 37–80)
HCT, POC: 41.1 % (ref 37.7–47.9)
Hemoglobin: 12.8 g/dL (ref 12.2–16.2)
Lymph, poc: 2.1 (ref 0.6–3.4)
MCH, POC: 29.8 pg (ref 27–31.2)
MCHC: 31.1 g/dL — AB (ref 31.8–35.4)
MCV: 95.8 fL (ref 80–97)
MID (cbc): 1 — AB (ref 0–0.9)
MPV: 8.8 fL (ref 0–99.8)
POC Granulocyte: 11.2 — AB (ref 2–6.9)
POC LYMPH PERCENT: 14.5 %L (ref 10–50)
POC MID %: 6.9 %M (ref 0–12)
Platelet Count, POC: 257 10*3/uL (ref 142–424)
RBC: 4.29 M/uL (ref 4.04–5.48)
RDW, POC: 13.4 %
WBC: 14.3 10*3/uL — AB (ref 4.6–10.2)

## 2013-08-07 LAB — COMPREHENSIVE METABOLIC PANEL
ALT: 14 U/L (ref 0–35)
AST: 11 U/L (ref 0–37)
Albumin: 4.1 g/dL (ref 3.5–5.2)
Alkaline Phosphatase: 111 U/L (ref 39–117)
BUN: 7 mg/dL (ref 6–23)
CO2: 24 mEq/L (ref 19–32)
Calcium: 9.7 mg/dL (ref 8.4–10.5)
Chloride: 98 mEq/L (ref 96–112)
Creat: 0.76 mg/dL (ref 0.50–1.10)
Glucose, Bld: 307 mg/dL — ABNORMAL HIGH (ref 70–99)
Potassium: 4.2 mEq/L (ref 3.5–5.3)
Sodium: 135 mEq/L (ref 135–145)
Total Bilirubin: 0.8 mg/dL (ref 0.3–1.2)
Total Protein: 7.5 g/dL (ref 6.0–8.3)

## 2013-08-07 LAB — POCT GLYCOSYLATED HEMOGLOBIN (HGB A1C): Hemoglobin A1C: 11

## 2013-08-07 MED ORDER — METFORMIN HCL ER 750 MG PO TB24: 750.0000 mg | ORAL_TABLET | Freq: Every day | ORAL | Status: DC

## 2013-08-07 MED ORDER — GLIPIZIDE 5 MG PO TABS: 5.0000 mg | ORAL_TABLET | Freq: Two times a day (BID) | ORAL | Status: DC

## 2013-08-07 MED ORDER — FINGERSTIX LANCETS MISC: Status: AC

## 2013-08-07 MED ORDER — HYDROCODONE-ACETAMINOPHEN 5-325 MG PO TABS: 1.0000 | ORAL_TABLET | Freq: Four times a day (QID) | ORAL | Status: DC | PRN

## 2013-08-07 MED ORDER — VALSARTAN-HYDROCHLOROTHIAZIDE 320-12.5 MG PO TABS: 1.0000 | ORAL_TABLET | Freq: Every day | ORAL | Status: DC

## 2013-08-07 MED ORDER — DOXYCYCLINE HYCLATE 100 MG PO TABS: 100.0000 mg | ORAL_TABLET | Freq: Two times a day (BID) | ORAL | Status: DC

## 2013-08-07 NOTE — Progress Notes (Signed)
Patient ID: SKAI LICKTEIG MRN: [id number], DOB: 08-05-1961, 52 y.o. Date of Encounter: 08/07/2013, 11:18 AM    PROCEDURE NOTE: Verbal consent obtained. Local anesthesia obtained with 1% lidocaine with epinephrine.   1 cm incision made with 11 blade along lesion.  Culture taken. Copious purulence expressed. Lesion explored revealing no loculations. Packed with 1/4 plain packing, about 20 cm of packing used.  Dressed. Wound care instructions including precautions with patient. Patient tolerated the procedure well. Recheck in 48 hours    Abscess of left groin - Plan: doxycycline (VIBRA-TABS) 100 MG tablet, HYDROcodone-acetaminophen (NORCO) 5-325 MG per tablet, Wound culture, POCT CBC  Hypertension - Plan: POCT CBC, POCT glycosylated hemoglobin (Hb A1C), Comprehensive metabolic panel, valsartan-hydrochlorothiazide (DIOVAN-HCT) 320-12.5 MG per tablet  Diabetes - Plan: POCT CBC, POCT glycosylated hemoglobin (Hb A1C), Comprehensive metabolic panel, Lipid Profile, metFORMIN (GLUCOPHAGE-XR) 750 MG 24 hr tablet, glipiZIDE (GLUCOTROL) 5 MG tablet, FINGERSTIX LANCETS MISC  Type II or unspecified type diabetes mellitus without mention of complication, not stated as uncontrolled - Plan: glipiZIDE (GLUCOTROL) 5 MG tablet  Hyperglycemia - Plan: glipiZIDE (GLUCOTROL) 5 MG tablet    Signed, Rhoderick Moody, PA-C 08/07/2013 11:18 AM

## 2013-08-07 NOTE — Progress Notes (Signed)
    This chart was scribed for Holly Sidle, MD by Holly Spencer, ED Scribe. This patient was seen in room Room 5 and the patient's care was started 10:44 AM.  Patient Name: Holly Spencer Date of Birth: 02/04/1961 Medical Record Number: [id number] Gender: female Date of Encounter: 08/07/2013  Chief Complaint: rx refills and Recurrent Skin Infections   History of Present Illness:  Holly Spencer is a 52 y.o. very pleasant female with a h/o DM patient who presents with the following:  Pt [facility] to [facility] c/o of a gradual onset, gradually worsening boil to the left inner thigh that first appeared 1 month ago. She states she has tried OTC boil treatment and "cabbage leaves" with no relief. She states her pain is consistently becoming more severe. She says sitting, standing, and certain positions worsens her discomfort. She denies fever or chills.   Pt currently works for a [employer]  Patient Active Problem List   Diagnosis Date Noted  . Diabetes mellitus type II 09/21/2011  . Hypertension, uncontrolled 09/21/2011   Past Medical History  Diagnosis Date  . Diabetes mellitus without complication   . Hypertension    History reviewed. No pertinent past surgical history. History  Substance Use Topics  . Smoking status: Never Smoker   . Smokeless tobacco: Not on file  . Alcohol Use: Not on file   History reviewed. No pertinent family history. No Known Allergies  Medication list has been reviewed and updated.  Current Outpatient Prescriptions on File Prior to Visit  Medication Sig Dispense Refill  . Blood Glucose Monitoring Suppl (BLOOD GLUCOSE METER) kit Use as instructed  1 each  0  . [employer] Lancets MISC To check blood sugar tid/ 250.00 dx code uncontrolled diabetes  1 each  0  . metFORMIN (GLUCOPHAGE-XR) 750 MG 24 hr tablet TAKE 1 TABLET (750 MG TOTAL) BY MOUTH DAILY  30 tablet  2  . valsartan-hydrochlorothiazide (DIOVAN-HCT) 320-12.5 MG per tablet TAKE 1  TABLET BY MOUTH DAILY  30 tablet  2  . clindamycin (CLEOCIN) 300 MG capsule Take 300 mg by mouth 3 (three) times daily.      Marland Kitchen doxycycline (VIBRAMYCIN) 50 MG capsule 2 pills po for 1 dose  2 capsule  0  . glipiZIDE (GLUCOTROL) 5 MG tablet Take 1 tablet (5 mg total) by mouth 2 (two) times daily before a meal.  60 tablet  3  . methocarbamol (ROBAXIN-750) 750 MG tablet Take 2 pills 3 times daily for muscle relaxant  30 tablet  1   No current facility-administered medications on file prior to visit.    Review of Systems:    Physical Examination: Filed Vitals:   08/07/13 0955  BP: 148/92  Pulse: 97  Temp: 99.4 F (37.4 C)  Resp: 18   @vitals2 @ Body mass index is 35.17 kg/(m^2). Ideal Body Weight: @FLOWAMB ([id number])@ Four centimeter left inguinal abscess identified with central pustule. There is fluctuant material Moving 4 extremities is good No acute distress   EKG / Labs / Xrays: None available at time of encounter  Assessment and Plan:   Holly Spencer

## 2013-08-07 NOTE — Patient Instructions (Signed)
Abscess An abscess is an infected area that contains a collection of pus and debris.It can occur in almost any part of the body. An abscess is also known as a furuncle or boil. CAUSES  An abscess occurs when tissue gets infected. This can occur from blockage of oil or sweat glands, infection of hair follicles, or a minor injury to the skin. As the body tries to fight the infection, pus collects in the area and creates pressure under the skin. This pressure causes pain. People with weakened immune systems have difficulty fighting infections and get certain abscesses more often.  SYMPTOMS Usually an abscess develops on the skin and becomes a painful mass that is red, warm, and tender. If the abscess forms under the skin, you may feel a moveable soft area under the skin. Some abscesses break open (rupture) on their own, but most will continue to get worse without care. The infection can spread deeper into the body and eventually into the bloodstream, causing you to feel ill.  DIAGNOSIS  Your caregiver will take your medical history and perform a physical exam. A sample of fluid may also be taken from the abscess to determine what is causing your infection. TREATMENT  Your caregiver may prescribe antibiotic medicines to fight the infection. However, taking antibiotics alone usually does not cure an abscess. Your caregiver may need to make a small cut (incision) in the abscess to drain the pus. In some cases, gauze is packed into the abscess to reduce pain and to continue draining the area. HOME CARE INSTRUCTIONS   Only take over-the-counter or prescription medicines for pain, discomfort, or fever as directed by your caregiver.  If you were prescribed antibiotics, take them as directed. Finish them even if you start to feel better.  If gauze is used, follow your caregiver's directions for changing the gauze.  To avoid spreading the infection:  Keep your draining abscess covered with a  bandage.  Wash your hands well.  Do not share personal care items, towels, or whirlpools with others.  Avoid skin contact with others.  Keep your skin and clothes clean around the abscess.  Keep all follow-up appointments as directed by your caregiver. SEEK MEDICAL CARE IF:   You have increased pain, swelling, redness, fluid drainage, or bleeding.  You have muscle aches, chills, or a general ill feeling.  You have a fever. MAKE SURE YOU:   Understand these instructions.  Will watch your condition.  Will get help right away if you are not doing well or get worse. Document Released: 05/15/2005 Document Revised: 02/04/2012 Document Reviewed: 10/18/2011 Exit[employer] Patient Information 2014 [employer].  

## 2013-08-08 ENCOUNTER — Other Ambulatory Visit: Payer: Self-pay | Admitting: Family Medicine

## 2013-08-08 DIAGNOSIS — E1110 Type 2 diabetes mellitus with ketoacidosis without coma: Secondary | ICD-10-CM

## 2013-08-09 ENCOUNTER — Ambulatory Visit (INDEPENDENT_AMBULATORY_CARE_PROVIDER_SITE_OTHER): Payer: Managed Care, Other (non HMO) | Admitting: Physician Assistant

## 2013-08-09 VITALS — BP 128/82 | HR 100 | Temp 98.0°F | Resp 17 | Ht 65.0 in | Wt 206.0 lb

## 2013-08-09 DIAGNOSIS — L02419 Cutaneous abscess of limb, unspecified: Secondary | ICD-10-CM

## 2013-08-09 LAB — WOUND CULTURE: Gram Stain: NONE SEEN

## 2013-08-09 NOTE — Progress Notes (Signed)
   Patient ID: Holly Spencer MRN: [id number], DOB: 1960-11-07 52 y.o. Date of Encounter: 08/09/2013, 9:41 AM  Primary Physician: Alva Garnet., MD  Chief Complaint: Wound care   See previous note  HPI: 52 y.o. female presents for wound care s/p I&D on 08/07/13 Doing well No issues or complaints Afebrile/ no chills No nausea or vomiting Tolerating doxycycline Pain improving Daily dressing change Previous note reviewed  Past Medical History  Diagnosis Date  . Diabetes mellitus without complication   . Hypertension      Home Meds: Prior to Admission medications   Medication Sig Start Date End Date Taking? Authorizing Provider  Blood Glucose Monitoring Suppl (BLOOD GLUCOSE METER) kit Use as instructed 05/25/12  Yes Sarah Harvie Bridge, PA-C  doxycycline (VIBRA-TABS) 100 MG tablet Take 1 tablet (100 mg total) by mouth 2 (two) times daily. 08/07/13  Yes Elvina Sidle, MD  [employer] LANCETS MISC To check blood sugar tid/ 250.00 dx code uncontrolled diabetes 08/07/13  Yes Elvina Sidle, MD  glipiZIDE (GLUCOTROL) 5 MG tablet Take 1 tablet (5 mg total) by mouth 2 (two) times daily before a meal. 08/07/13  Yes Elvina Sidle, MD  HYDROcodone-acetaminophen (NORCO) 5-325 MG per tablet Take 1 tablet by mouth every 6 (six) hours as needed for moderate pain. 08/07/13  Yes Elvina Sidle, MD  metFORMIN (GLUCOPHAGE-XR) 750 MG 24 hr tablet Take 1 tablet (750 mg total) by mouth daily with breakfast. 08/07/13  Yes Elvina Sidle, MD  methocarbamol (ROBAXIN-750) 750 MG tablet Take 2 pills 3 times daily for muscle relaxant 12/02/11  Yes Peyton Najjar, MD  valsartan-hydrochlorothiazide (DIOVAN-HCT) 320-12.5 MG per tablet Take 1 tablet by mouth daily. 08/07/13  Yes Elvina Sidle, MD    Allergies: No Known Allergies  ROS: Constitutional: Afebrile, no chills Cardiovascular: negative for chest pain or palpitations Dermatological: Positive for wound and improving pain. Negative for  erythema or warmth  GI: No nausea or vomiting   EXAM: Physical Exam: Blood pressure 128/82, pulse 100, temperature 98 F (36.7 C), temperature source Oral, resp. rate 17, height 5\' 5"  (1.651 m), weight 206 lb (93.441 kg), SpO2 98.00%., Body mass index is 34.28 kg/(m^2). General: Well developed, well nourished, in no acute distress. Nontoxic appearing. Head: Normocephalic, atraumatic, sclera non-icteric.  Neck: Supple. Lungs: Breathing is unlabored. Heart: Normal rate. Skin:  Warm and moist. Dressing and packing in place. Mild TTP. No induration or erythema. Neuro: Alert and oriented X 3. Moves all extremities spontaneously. Normal gait.  Psych:  Responds to questions appropriately with a normal affect.   PROCEDURE: Dressing and packing removed. Small amount of purulence expressed Wound bed healthy Irrigated with 1% plain lidocaine 5 cc. Repacked with 1/4 inch plain packing Dressing applied  Amy Cecil in room for entire procedure.   LAB: Culture: no growth 1 day  A/P: 52 y.o. female with cellulitis/abscess as above s/p I&D on 08/07/13 -Wound care per above -Continue doxycycline -Pain improving -Daily dressing changes -Recheck 48 hours  Signed, Eula Listen, PA-C [facility] [place], [place] [address] [phone] 08/09/2013 9:41 AM

## 2014-01-11 ENCOUNTER — Encounter (HOSPITAL_COMMUNITY): Payer: Self-pay | Admitting: Emergency Medicine

## 2014-01-11 ENCOUNTER — Emergency Department (HOSPITAL_COMMUNITY): Payer: Managed Care, Other (non HMO)

## 2014-01-11 ENCOUNTER — Emergency Department (HOSPITAL_COMMUNITY)
Admission: EM | Admit: 2014-01-11 | Discharge: 2014-01-11 | Disposition: A | Discharge status: Home / Self Care | Payer: Managed Care, Other (non HMO) | Attending: Emergency Medicine | Admitting: Emergency Medicine

## 2014-01-11 DIAGNOSIS — R109 Unspecified abdominal pain: Secondary | ICD-10-CM | POA: Insufficient documentation

## 2014-01-11 DIAGNOSIS — R739 Hyperglycemia, unspecified: Secondary | ICD-10-CM

## 2014-01-11 DIAGNOSIS — R7309 Other abnormal glucose: Secondary | ICD-10-CM | POA: Insufficient documentation

## 2014-01-11 DIAGNOSIS — R112 Nausea with vomiting, unspecified: Secondary | ICD-10-CM | POA: Insufficient documentation

## 2014-01-11 DIAGNOSIS — I10 Essential (primary) hypertension: Secondary | ICD-10-CM | POA: Insufficient documentation

## 2014-01-11 DIAGNOSIS — E119 Type 2 diabetes mellitus without complications: Secondary | ICD-10-CM | POA: Insufficient documentation

## 2014-01-11 DIAGNOSIS — M6281 Muscle weakness (generalized): Secondary | ICD-10-CM | POA: Insufficient documentation

## 2014-01-11 DIAGNOSIS — Z79899 Other long term (current) drug therapy: Secondary | ICD-10-CM | POA: Insufficient documentation

## 2014-01-11 LAB — CBC WITH DIFFERENTIAL/PLATELET
BASOS ABS: 0 10*3/uL (ref 0.0–0.1)
Basophils Relative: 0 % (ref 0–1)
EOS ABS: 0 10*3/uL (ref 0.0–0.7)
Eosinophils Relative: 0 % (ref 0–5)
HEMATOCRIT: 46.8 % — AB (ref 36.0–46.0)
HEMOGLOBIN: 16.3 g/dL — AB (ref 12.0–15.0)
Lymphocytes Relative: 13 % (ref 12–46)
Lymphs Abs: 1.7 10*3/uL (ref 0.7–4.0)
MCH: 31.8 pg (ref 26.0–34.0)
MCHC: 34.8 g/dL (ref 30.0–36.0)
MCV: 91.4 fL (ref 78.0–100.0)
MONO ABS: 0.6 10*3/uL (ref 0.1–1.0)
MONOS PCT: 5 % (ref 3–12)
Neutro Abs: 10.6 10*3/uL — ABNORMAL HIGH (ref 1.7–7.7)
Neutrophils Relative %: 82 % — ABNORMAL HIGH (ref 43–77)
Platelets: 294 10*3/uL (ref 150–400)
RBC: 5.12 MIL/uL — ABNORMAL HIGH (ref 3.87–5.11)
RDW: 12.7 % (ref 11.5–15.5)
WBC: 12.9 10*3/uL — ABNORMAL HIGH (ref 4.0–10.5)

## 2014-01-11 LAB — COMPREHENSIVE METABOLIC PANEL
ALBUMIN: 3.9 g/dL (ref 3.5–5.2)
ALT: 19 U/L (ref 0–35)
AST: 14 U/L (ref 0–37)
Alkaline Phosphatase: 130 U/L — ABNORMAL HIGH (ref 39–117)
BUN: 21 mg/dL (ref 6–23)
CALCIUM: 10.2 mg/dL (ref 8.4–10.5)
CO2: 22 mEq/L (ref 19–32)
CREATININE: 0.78 mg/dL (ref 0.50–1.10)
Chloride: 90 mEq/L — ABNORMAL LOW (ref 96–112)
GFR calc Af Amer: >90 mL/min (ref >90)
GFR calc non Af Amer: >90 mL/min (ref >90)
Glucose, Bld: 345 mg/dL — ABNORMAL HIGH (ref 70–99)
Potassium: 3.8 mEq/L (ref 3.7–5.3)
Sodium: 135 mEq/L — ABNORMAL LOW (ref 137–147)
TOTAL PROTEIN: 8 g/dL (ref 6.0–8.3)
Total Bilirubin: 0.7 mg/dL (ref 0.3–1.2)

## 2014-01-11 LAB — CBG MONITORING, ED
GLUCOSE-CAPILLARY: 270 mg/dL — AB (ref 70–99)
Glucose-Capillary: 331 mg/dL — ABNORMAL HIGH (ref 70–99)
Glucose-Capillary: 252 mg/dL — ABNORMAL HIGH (ref 70–99)

## 2014-01-11 LAB — URINALYSIS, ROUTINE W REFLEX MICROSCOPIC
Bilirubin Urine: NEGATIVE
Glucose, UA: >1000 mg/dL — AB
Ketones, ur: >80 mg/dL — AB
Leukocytes, UA: NEGATIVE
Nitrite: NEGATIVE
Protein, ur: NEGATIVE mg/dL
SPECIFIC GRAVITY, URINE: 1.038 — AB (ref 1.005–1.030)
Urobilinogen, UA: 0.2 mg/dL (ref 0.0–1.0)
pH: 5.5 (ref 5.0–8.0)

## 2014-01-11 LAB — BASIC METABOLIC PANEL
BUN: 17 mg/dL (ref 6–23)
CO2: 20 meq/L (ref 19–32)
CREATININE: 0.61 mg/dL (ref 0.50–1.10)
Calcium: 9.2 mg/dL (ref 8.4–10.5)
Chloride: 95 mEq/L — ABNORMAL LOW (ref 96–112)
GFR calc Af Amer: >90 mL/min (ref >90)
GFR calc non Af Amer: >90 mL/min (ref >90)
GLUCOSE: 272 mg/dL — AB (ref 70–99)
Potassium: 4 mEq/L (ref 3.7–5.3)
Sodium: 133 mEq/L — ABNORMAL LOW (ref 137–147)

## 2014-01-11 LAB — URINE MICROSCOPIC-ADD ON

## 2014-01-11 LAB — TROPONIN I

## 2014-01-11 MED ORDER — HYDROCHLOROTHIAZIDE 12.5 MG PO CAPS
12.5000 mg | ORAL_CAPSULE | Freq: Every day | ORAL | Status: DC
  Administered 2014-01-11: 12.5 mg via ORAL
  Filled 2014-01-11: qty 1

## 2014-01-11 MED ORDER — ONDANSETRON HCL 4 MG PO TABS: 4.0000 mg | ORAL_TABLET | Freq: Four times a day (QID) | ORAL | Status: DC

## 2014-01-11 MED ORDER — SODIUM CHLORIDE 0.9 % IV SOLN
1000.0000 mL | INTRAVENOUS | Status: DC
  Administered 2014-01-11 (×2): 1000 mL via INTRAVENOUS

## 2014-01-11 MED ORDER — MORPHINE SULFATE 4 MG/ML IJ SOLN
4.0000 mg | Freq: Once | INTRAMUSCULAR | Status: AC
  Administered 2014-01-11: 4 mg via INTRAVENOUS
  Filled 2014-01-11: qty 1

## 2014-01-11 MED ORDER — IRBESARTAN 300 MG PO TABS
300.0000 mg | ORAL_TABLET | Freq: Every day | ORAL | Status: DC
  Administered 2014-01-11: 300 mg via ORAL
  Filled 2014-01-11: qty 1

## 2014-01-11 MED ORDER — ONDANSETRON HCL 4 MG/2ML IJ SOLN
4.0000 mg | Freq: Once | INTRAMUSCULAR | Status: AC
  Administered 2014-01-11: 4 mg via INTRAVENOUS
  Filled 2014-01-11: qty 2

## 2014-01-11 MED ORDER — SODIUM CHLORIDE 0.9 % IV SOLN
1000.0000 mL | Freq: Once | INTRAVENOUS | Status: AC
  Administered 2014-01-11: 1000 mL via INTRAVENOUS

## 2014-01-11 NOTE — ED Provider Notes (Signed)
CSN: [id number]     Arrival date & time 01/11/14  2119 History   First MD Initiated Contact with Patient 01/11/14 [phone]     Chief Complaint  Patient presents with  . Hyperglycemia     (Consider location/radiation/quality/duration/timing/severity/associated sxs/prior Treatment) Patient is a 53 y.o. female presenting with hyperglycemia. The history is provided by the patient. No language interpreter was used.  Hyperglycemia Blood sugar level PTA:  290 Diabetes status:  Controlled with oral medications Associated symptoms: abdominal pain, nausea and vomiting   Associated symptoms: no dizziness, no dysuria, no fever and no shortness of breath   Associated symptoms comment:  She has been ill for the past 2-3 days with nausea and vomiting. She has mild abdominal pain. Today, she found her blood sugar to be elevated to 290 at home and came for evaluation. She denies chest pain. She feels generally weak. No known fever. No urinary symptoms.    Past Medical History  Diagnosis Date  . Diabetes mellitus without complication   . Hypertension    Past Surgical History  Procedure Laterality Date  . Abdominal hysterectomy     No family history on file. History  Substance Use Topics  . Smoking status: Never Smoker   . Smokeless tobacco: Never Used  . Alcohol Use: No   OB History   Grav Para Term Preterm Abortions TAB SAB Ect Mult Living                 Review of Systems  Constitutional: Negative for fever and chills.  HENT: Negative.  Negative for congestion.   Respiratory: Negative.  Negative for cough and shortness of breath.   Cardiovascular: Negative.   Gastrointestinal: Positive for nausea, vomiting and abdominal pain. Negative for diarrhea.  Genitourinary: Negative.  Negative for dysuria.  Musculoskeletal: Negative.  Negative for myalgias.  Skin: Negative.   Neurological: Positive for weakness. Negative for dizziness and headaches.      Allergies  Review of patient's  allergies indicates no known allergies.  Home Medications   Prior to Admission medications   Medication Sig Start Date End Date Taking? Authorizing Provider  Blood Glucose Monitoring Suppl (BLOOD GLUCOSE METER) kit Use as instructed 05/25/12  Yes Mancel Bale, PA-C  FINGERSTIX LANCETS MISC To check blood sugar tid/ 250.00 dx code uncontrolled diabetes 08/07/13  Yes Robyn Haber, MD  glipiZIDE (GLUCOTROL) 5 MG tablet Take 1 tablet (5 mg total) by mouth 2 (two) times daily before a meal. 08/07/13  Yes Robyn Haber, MD  metFORMIN (GLUCOPHAGE-XR) 750 MG 24 hr tablet Take 1 tablet (750 mg total) by mouth daily with breakfast. 08/07/13  Yes Robyn Haber, MD  valsartan-hydrochlorothiazide (DIOVAN-HCT) 320-12.5 MG per tablet Take 1 tablet by mouth daily. 08/07/13  Yes Robyn Haber, MD   BP 205/78  Pulse 69  Temp(Src) 97.9 F (36.6 C) (Oral)  Resp 14  Ht _0  (1.626 m)  Wt 210 lb (95.255 kg)  BMI 36.03 kg/m2  SpO2 98% Physical Exam  Constitutional: She is oriented to person, place, and time. She appears well-developed and well-nourished.  HENT:  Head: Normocephalic.  Neck: Normal range of motion. Neck supple.  Cardiovascular: Normal rate and regular rhythm.   Pulmonary/Chest: Effort normal and breath sounds normal.  Abdominal: Soft. Bowel sounds are normal. There is no tenderness. There is no rebound and no guarding.  Musculoskeletal: Normal range of motion.  Neurological: She is alert and oriented to person, place, and time.  Skin: Skin is warm and dry.  No rash noted.  Psychiatric: She has a normal mood and affect.    ED Course  Procedures (including critical care time) Labs Review Labs Reviewed  CBC WITH DIFFERENTIAL - Abnormal; Notable for the following:    WBC 12.9 (*)    RBC 5.12 (*)    Hemoglobin 16.3 (*)    HCT 46.8 (*)    Neutrophils Relative % 82 (*)    Neutro Abs 10.6 (*)    All other components within normal limits  COMPREHENSIVE METABOLIC PANEL -  Abnormal; Notable for the following:    Sodium 135 (*)    Chloride 90 (*)    Glucose, Bld 345 (*)    Alkaline Phosphatase 130 (*)    All other components within normal limits  URINALYSIS, ROUTINE W REFLEX MICROSCOPIC - Abnormal; Notable for the following:    Specific Gravity, Urine 1.038 (*)    Glucose, UA >1000 (*)    Hgb urine dipstick TRACE (*)    Ketones, ur >80 (*)    All other components within normal limits  URINE MICROSCOPIC-ADD ON - Abnormal; Notable for the following:    Bacteria, UA FEW (*)    Casts HYALINE CASTS (*)    All other components within normal limits  CBG MONITORING, ED - Abnormal; Notable for the following:    Glucose-Capillary 331 (*)    All other components within normal limits  TROPONIN I    Imaging Review Dg Chest Portable 1 View  01/11/2014   CLINICAL DATA:  Weakness  EXAM: PORTABLE CHEST - 1 VIEW  COMPARISON:  05/22/2012  FINDINGS: The heart and pulmonary vascularity are within normal limits. The lungs are clear bilaterally. Chronic changes of the right fifth rib are noted laterally. No new focal abnormality is seen.  IMPRESSION: No acute abnormality is noted.   Electronically Signed   By: Inez Catalina M.D.   On: 01/11/2014 10:48     EKG Interpretation   Date/Time:  Tuesday Jan 11 2014 10:40:29 EDT Ventricular Rate:  78 PR Interval:  119 QRS Duration: 124 QT Interval:  423 QTC Calculation: 482 R Axis:   24 Text Interpretation:  Sinus rhythm Atrial premature complexes Borderline  short PR interval Consider right atrial enlargement Nonspecific  intraventricular conduction delay Repol abnrm suggests ischemia, inferior  leads No prior Confirmed by HORTON  MD, COURTNEY ([id number]) on 01/11/2014  10:47:55 AM      MDM   Final diagnoses:  None    1. Nausea and vomiting 2. Hyperglycemia  No evidence acidosis. Vomiting controlled in ED - she is tolerating PO fluids. Feeling much better after IV fluids. Blood sugar decreasing. Stable for discharge  home.     Dewaine Oats, PA-C 01/18/14 [phone]

## 2014-01-11 NOTE — ED Notes (Signed)
Pt presents from home with c/o of high blood sugar with a reading at home of CBG 290.  Pt states she has been feeling sick since Friday with nausea and vomiting with associated abdominal pain.

## 2014-01-11 NOTE — Discharge Instructions (Signed)
Hyperglycemia °Hyperglycemia occurs when the glucose (sugar) in your blood is too high. Hyperglycemia can happen for many reasons, but it most often happens to people who do not know they have diabetes or are not managing their diabetes properly.  °CAUSES  °Whether you have diabetes or not, there are other causes of hyperglycemia. Hyperglycemia can occur when you have diabetes, but it can also occur in other situations that you might not be as aware of, such as: °Diabetes °· If you have diabetes and are having problems controlling your blood glucose, hyperglycemia could occur because of some of the following reasons: °· Not following your meal plan. °· Not taking your diabetes medications or not taking it properly. °· Exercising less or doing less activity than you normally do. °· Being sick. °Pre-diabetes °· This cannot be ignored. Before people develop Type 2 diabetes, they almost always have "pre-diabetes." This is when your blood glucose levels are higher than normal, but not yet high enough to be diagnosed as diabetes. Research has shown that some long-term damage to the body, especially the heart and circulatory system, may already be occurring during pre-diabetes. If you take action to manage your blood glucose when you have pre-diabetes, you may delay or prevent Type 2 diabetes from developing. °Stress °· If you have diabetes, you may be "diet" controlled or on oral medications or insulin to control your diabetes. However, you may find that your blood glucose is higher than usual in the hospital whether you have diabetes or not. This is often referred to as "stress hyperglycemia." Stress can elevate your blood glucose. This happens because of hormones put out by the body during times of stress. If stress has been the cause of your high blood glucose, it can be followed regularly by your caregiver. That way he/she can make sure your hyperglycemia does not continue to get worse or progress to  diabetes. °Steroids °· Steroids are medications that act on the infection fighting system (immune system) to block inflammation or infection. One side effect can be a rise in blood glucose. Most people can produce enough extra insulin to allow for this rise, but for those who cannot, steroids make blood glucose levels go even higher. It is not unusual for steroid treatments to "uncover" diabetes that is developing. It is not always possible to determine if the hyperglycemia will go away after the steroids are stopped. A special blood test called an A1c is sometimes done to determine if your blood glucose was elevated before the steroids were started. °SYMPTOMS °· Thirsty. °· Frequent urination. °· Dry mouth. °· Blurred vision. °· Tired or fatigue. °· Weakness. °· Sleepy. °· Tingling in feet or leg. °DIAGNOSIS  °Diagnosis is made by monitoring blood glucose in one or all of the following ways: °· A1c test. This is a chemical found in your blood. °· Fingerstick blood glucose monitoring. °· Laboratory results. °TREATMENT  °First, knowing the cause of the hyperglycemia is important before the hyperglycemia can be treated. Treatment may include, but is not be limited to: °· Education. °· Change or adjustment in medications. °· Change or adjustment in meal plan. °· Treatment for an illness, infection, etc. °· More frequent blood glucose monitoring. °· Change in exercise plan. °· Decreasing or stopping steroids. °· Lifestyle changes. °HOME CARE INSTRUCTIONS  °· Test your blood glucose as directed. °· Exercise regularly. Your caregiver will give you instructions about exercise. Pre-diabetes or diabetes which comes on with stress is helped by exercising. °· Eat wholesome,   balanced meals. Eat often and at regular, fixed times. Your caregiver or nutritionist will give you a meal plan to guide your sugar intake.  Being at an ideal weight is important. If needed, losing as little as 10 to 15 pounds may help improve blood  glucose levels. SEEK MEDICAL CARE IF:   You have questions about medicine, activity, or diet.  You continue to have symptoms (problems such as increased thirst, urination, or weight gain). SEEK IMMEDIATE MEDICAL CARE IF:   You are vomiting or have diarrhea.  Your breath smells fruity.  You are breathing faster or slower.  You are very sleepy or incoherent.  You have numbness, tingling, or pain in your feet or hands.  You have chest pain.  Your symptoms get worse even though you have been following your caregiver's orders.  If you have any other questions or concerns. Document Released: 01/29/2001 Document Revised: 10/28/2011 Document Reviewed: 12/02/2011 [facility] Patient Information 2014 [place], [place]. Diet The clear liquid diet consists of foods that are liquid or will become liquid at room temperature. Examples of foods allowed on a clear liquid diet include fruit juice, broth or bouillon, gelatin, or frozen ice pops. You should be able to see through the liquid. The purpose of this diet is to provide the necessary fluids, electrolytes (such as sodium and potassium), and energy to keep the body functioning during times when you are not able to consume a regular diet. A clear liquid diet should not be continued for long periods of time, as it is not nutritionally adequate.  A CLEAR LIQUID DIET MAY BE NEEDED:  When a sudden-onset (acute) condition occurs before or after surgery.   As the first step in oral feeding.   For fluid and electrolyte replacement in diarrheal diseases.   As a diet before certain medical tests are performed.  ADEQUACY The clear liquid diet is adequate only in ascorbic acid, according to the Recommended Dietary Allowances of the [employer].  CHOOSING FOODS Breads and Starches  Allowed: None are allowed.   Avoid: All are to be avoided.  Vegetables  Allowed: Strained vegetable juices.   Avoid: Any others.   Fruit  Allowed: Strained fruit juices and fruit drinks. Include 1 serving of citrus or vitamin C-enriched fruit juice daily.   Avoid: Any others.  Meat and Meat Substitutes  Allowed: None are allowed.   Avoid: All are to be avoided.  Milk Products  Allowed: None are allowed.   Avoid: All are to be avoided.  Soups and Combination Foods  Allowed: Clear bouillon, broth, or strained broth-based soups.   Avoid: Any others.  Desserts and Sweets  Allowed: Sugar, honey. High-protein gelatin. Flavored gelatin, ices, or frozen ice pops that do not contain milk.   Avoid: Any others.  Fats and Oils  Allowed: None are allowed.   Avoid: All are to be avoided.  Beverages  Allowed: Cereal beverages, coffee (regular or decaffeinated), tea, or soda at the discretion of your health care provider.   Avoid: Any others.  Condiments  Allowed: Salt.   Avoid: Any others, including pepper.  Supplements  Allowed: Liquid nutrition beverages that you can see through.   Avoid: Any others that contain lactose or fiber. SAMPLE MEAL PLAN Breakfast  4 oz (120 mL) strained orange juice.   to 1 cup (120 to 240 mL) gelatin (plain or fortified).  1 cup (240 mL) beverage (coffee or tea).  Sugar, if desired. Midmorning Snack   cup (120 mL) gelatin (  plain or fortified). Lunch  1 cup (240 mL) broth or consomm.  4 oz (120 mL) strained grapefruit juice.   cup (120 mL) gelatin (plain or fortified).  1 cup (240 mL) beverage (coffee or tea).  Sugar, if desired. Midafternoon Snack   cup (120 mL) fruit ice.   cup (120 mL) strained fruit juice. Dinner  1 cup (240 mL) broth or consomm.   cup (120 mL) cranberry juice.   cup (120 mL) flavored gelatin (plain or fortified).  1 cup (240 mL) beverage (coffee or tea).  Sugar, if desired. Evening Snack  4 oz (120 mL) strained apple juice (vitamin C-fortified).   cup (120 mL)  flavored gelatin (plain or fortified). MAKE SURE YOU:  Understand these instructions.  Will watch your child's condition.  Will get help right away if your child is not doing well or gets worse. Document Released: 08/05/2005 Document Revised: 04/07/2013 Document Reviewed: 01/05/2013 [facility] Patient Information 2014 [place], [place].

## 2014-01-18 NOTE — ED Provider Notes (Signed)
Medical screening examination/treatment/procedure(s) were performed by non-physician practitioner and as supervising physician I was immediately available for consultation/collaboration.   EKG Interpretation   Date/Time:  Tuesday Jan 11 2014 10:40:29 EDT Ventricular Rate:  78 PR Interval:  119 QRS Duration: 124 QT Interval:  423 QTC Calculation: 482 R Axis:   24 Text Interpretation:  Sinus rhythm Atrial premature complexes Borderline  short PR interval Consider right atrial enlargement Nonspecific  intraventricular conduction delay Repol abnrm suggests ischemia, inferior  leads No prior Confirmed by Wilkie Aye  MD, COURTNEY ([id number]) on 01/11/2014  10:47:55 AM        Shon Baton, MD 01/18/14 1044

## 2014-07-27 ENCOUNTER — Emergency Department (HOSPITAL_COMMUNITY)
Admission: EM | Admit: 2014-07-27 | Discharge: 2014-07-28 | Disposition: A | Discharge status: Home / Self Care | Payer: Managed Care, Other (non HMO) | Attending: Emergency Medicine | Admitting: Emergency Medicine

## 2014-07-27 ENCOUNTER — Encounter (HOSPITAL_COMMUNITY): Payer: Self-pay | Admitting: Emergency Medicine

## 2014-07-27 ENCOUNTER — Emergency Department (HOSPITAL_COMMUNITY): Payer: Managed Care, Other (non HMO)

## 2014-07-27 DIAGNOSIS — R079 Chest pain, unspecified: Secondary | ICD-10-CM | POA: Diagnosis present

## 2014-07-27 DIAGNOSIS — R109 Unspecified abdominal pain: Secondary | ICD-10-CM | POA: Diagnosis not present

## 2014-07-27 DIAGNOSIS — R112 Nausea with vomiting, unspecified: Secondary | ICD-10-CM | POA: Insufficient documentation

## 2014-07-27 DIAGNOSIS — E1165 Type 2 diabetes mellitus with hyperglycemia: Secondary | ICD-10-CM | POA: Insufficient documentation

## 2014-07-27 DIAGNOSIS — R630 Anorexia: Secondary | ICD-10-CM | POA: Diagnosis not present

## 2014-07-27 DIAGNOSIS — Z79899 Other long term (current) drug therapy: Secondary | ICD-10-CM | POA: Diagnosis not present

## 2014-07-27 DIAGNOSIS — I10 Essential (primary) hypertension: Secondary | ICD-10-CM | POA: Diagnosis not present

## 2014-07-27 DIAGNOSIS — R739 Hyperglycemia, unspecified: Secondary | ICD-10-CM

## 2014-07-27 DIAGNOSIS — R0602 Shortness of breath: Secondary | ICD-10-CM | POA: Insufficient documentation

## 2014-07-27 LAB — CBG MONITORING, ED: Glucose-Capillary: 239 mg/dL — ABNORMAL HIGH (ref 70–99)

## 2014-07-27 LAB — CBC
HEMATOCRIT: 43.7 % (ref 36.0–46.0)
HEMOGLOBIN: 15.2 g/dL — AB (ref 12.0–15.0)
MCH: 31 pg (ref 26.0–34.0)
MCHC: 34.8 g/dL (ref 30.0–36.0)
MCV: 89.2 fL (ref 78.0–100.0)
Platelets: 333 10*3/uL (ref 150–400)
RBC: 4.9 MIL/uL (ref 3.87–5.11)
RDW: 12.5 % (ref 11.5–15.5)
WBC: 13.4 10*3/uL — ABNORMAL HIGH (ref 4.0–10.5)

## 2014-07-27 LAB — BASIC METABOLIC PANEL
Anion gap: 21 — ABNORMAL HIGH (ref 5–15)
BUN: 10 mg/dL (ref 6–23)
CHLORIDE: 91 meq/L — AB (ref 96–112)
CO2: 22 mEq/L (ref 19–32)
Calcium: 10.8 mg/dL — ABNORMAL HIGH (ref 8.4–10.5)
Creatinine, Ser: 0.55 mg/dL (ref 0.50–1.10)
GFR calc Af Amer: >90 mL/min (ref >90)
GFR calc non Af Amer: >90 mL/min (ref >90)
GLUCOSE: 236 mg/dL — AB (ref 70–99)
Potassium: 4.1 mEq/L (ref 3.7–5.3)
Sodium: 134 mEq/L — ABNORMAL LOW (ref 137–147)

## 2014-07-27 LAB — I-STAT TROPONIN, ED: TROPONIN I, POC: 0 ng/mL (ref 0.00–0.08)

## 2014-07-27 LAB — PRO B NATRIURETIC PEPTIDE: Pro B Natriuretic peptide (BNP): 575 pg/mL — ABNORMAL HIGH (ref 0–125)

## 2014-07-27 MED ORDER — SODIUM CHLORIDE 0.9 % IV BOLUS (SEPSIS)
2000.0000 mL | Freq: Once | INTRAVENOUS | Status: AC
  Administered 2014-07-28: 2000 mL via INTRAVENOUS

## 2014-07-27 MED ORDER — FENTANYL CITRATE 0.05 MG/ML IJ SOLN
100.0000 ug | Freq: Once | INTRAMUSCULAR | Status: AC
  Administered 2014-07-27: 100 ug via INTRAVENOUS
  Filled 2014-07-27: qty 2

## 2014-07-27 NOTE — ED Notes (Signed)
Pt. returned from XR. 

## 2014-07-27 NOTE — ED Notes (Signed)
Pt. reports left chest pain with SOB , nausea and vomiting onset this afternoon , pt. also reported elevated blood sugar at home this evening .

## 2014-07-27 NOTE — ED Provider Notes (Signed)
CSN: [id number]     Arrival date & time 07/27/14  2111 History   First MD Initiated Contact with Patient 07/27/14 2206     Chief Complaint  Patient presents with  . Chest Pain  . Hyperglycemia     (Consider location/radiation/quality/duration/timing/severity/associated sxs/prior Treatment) Patient is a 53 y.o. female presenting with chest pain.  Chest Pain Pain location:  L chest Pain quality: aching and pressure   Pain radiates to:  Does not radiate Pain radiates to the back: no   Pain severity:  Mild Timing:  Constant Chronicity:  New Context: not breathing   Relieved by:  None tried Worsened by:  Nothing tried Associated symptoms: abdominal pain, nausea, shortness of breath and vomiting   Associated symptoms: no back pain, no fever and no headache     Past Medical History  Diagnosis Date  . Diabetes mellitus without complication   . Hypertension    Past Surgical History  Procedure Laterality Date  . Abdominal hysterectomy     No family history on file. History  Substance Use Topics  . Smoking status: Never Smoker   . Smokeless tobacco: Never Used  . Alcohol Use: No   OB History    No data available     Review of Systems  Constitutional: Positive for activity change and appetite change. Negative for fever.  HENT: Negative for congestion and ear pain.   Eyes: Negative for pain.  Respiratory: Positive for shortness of breath.   Cardiovascular: Positive for chest pain.  Gastrointestinal: Positive for nausea, vomiting and abdominal pain. Negative for diarrhea.  Endocrine: Negative for polydipsia and polyuria.  Musculoskeletal: Negative for back pain and neck pain.  Neurological: Negative for syncope and headaches.  All other systems reviewed and are negative.     Allergies  Review of patient's allergies indicates no known allergies.  Home Medications   Prior to Admission medications   Medication Sig Start Date End Date Taking? Authorizing Provider   glipiZIDE (GLUCOTROL) 5 MG tablet Take 1 tablet (5 mg total) by mouth 2 (two) times daily before a meal. 08/07/13  Yes Robyn Haber, MD  metFORMIN (GLUCOPHAGE-XR) 750 MG 24 hr tablet Take 1 tablet (750 mg total) by mouth daily with breakfast. 08/07/13  Yes Robyn Haber, MD  valsartan-hydrochlorothiazide (DIOVAN-HCT) 320-12.5 MG per tablet Take 1 tablet by mouth daily. 08/07/13  Yes Robyn Haber, MD  Blood Glucose Monitoring Suppl (BLOOD GLUCOSE METER) kit Use as instructed 05/25/12   Mancel Bale, PA-C  FINGERSTIX LANCETS MISC To check blood sugar tid/ 250.00 dx code uncontrolled diabetes 08/07/13   Robyn Haber, MD  ondansetron (ZOFRAN) 4 MG tablet Take 1 tablet (4 mg total) by mouth every 6 (six) hours. 01/11/14   Shari A Upstill, PA-C   BP 226/104 mmHg  Pulse 67  Resp 16  SpO2 99% Physical Exam  Constitutional: She is oriented to person, place, and time. She appears well-developed and well-nourished.  HENT:  Head: Normocephalic and atraumatic.  Eyes: Conjunctivae and EOM are normal. Right eye exhibits no discharge. Left eye exhibits no discharge.  Cardiovascular: Normal rate and regular rhythm.   Pulmonary/Chest: Effort normal and breath sounds normal. No respiratory distress.  Abdominal: Soft. She exhibits no distension. There is no tenderness. There is no rebound.  Musculoskeletal: Normal range of motion. She exhibits no edema or tenderness.  Neurological: She is alert and oriented to person, place, and time.  Skin: Skin is warm and dry.  Nursing note and vitals reviewed.  ED Course  Procedures (including critical care time) Labs Review Labs Reviewed  CBC - Abnormal; Notable for the following:    WBC 13.4 (*)    Hemoglobin 15.2 (*)    All other components within normal limits  BASIC METABOLIC PANEL - Abnormal; Notable for the following:    Sodium 134 (*)    Chloride 91 (*)    Glucose, Bld 236 (*)    Calcium 10.8 (*)    Anion gap 21 (*)    All other components  within normal limits  PRO B NATRIURETIC PEPTIDE - Abnormal; Notable for the following:    Pro B Natriuretic peptide (BNP) 575.0 (*)    All other components within normal limits  BASIC METABOLIC PANEL - Abnormal; Notable for the following:    Sodium 136 (*)    Chloride 94 (*)    Glucose, Bld 208 (*)    Anion gap 18 (*)    All other components within normal limits  CBG MONITORING, ED - Abnormal; Notable for the following:    Glucose-Capillary 239 (*)    All other components within normal limits  TROPONIN I  I-STAT TROPOININ, ED    Imaging Review Dg Chest 2 View  07/27/2014   CLINICAL DATA:  Left side chest pain and shortness of Breath for couple of days  EXAM: CHEST  2 VIEW  COMPARISON:  01/11/2014  FINDINGS: Cardiomediastinal silhouette is stable. No acute infiltrate or pulmonary edema. 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 Mild degenerative changes thoracic spine.  IMPRESSION: No active cardiopulmonary disease.   Electronically Signed   By: Lahoma Crocker M.D.   On: 07/27/2014 22:09     EKG Interpretation None      MDM   Final diagnoses:  Chest pain  SOB (shortness of breath)    53 year old 3 days of nausea vomiting and belly pain. Secondary to the belly pain patient started having high blood pressures which causes her to have some left chest pain with shortness of breath. States she's been hyperglycemic secondary to improper diet. I will hear her EKG was without any evidence of ischemia and she was hypertensive. After 1 dose of pain medicine and her pain improved her chest pain went away as did her blood pressure improved. Initial troponin was negative labs were okay. For the chest pain we will do a delta troponin  to ensure this isn't cardiac in origin. She is low risk but will still need a stress echocardiogram as an outpatient. As long as her blood sugar improves, troponin is negative and her anion gap improves she will be stable dc fu w/ PCP for cardiology  consult.    Merrily Pew, MD 08/01/14 1148  Pamella Pert, MD 08/02/14 [phone]

## 2014-07-27 NOTE — ED Notes (Signed)
Patient transported to X-ray 

## 2014-07-28 LAB — BASIC METABOLIC PANEL
Anion gap: 18 — ABNORMAL HIGH (ref 5–15)
BUN: 12 mg/dL (ref 6–23)
CO2: 24 meq/L (ref 19–32)
Calcium: 10 mg/dL (ref 8.4–10.5)
Chloride: 94 mEq/L — ABNORMAL LOW (ref 96–112)
Creatinine, Ser: 0.77 mg/dL (ref 0.50–1.10)
GFR calc Af Amer: >90 mL/min (ref >90)
GFR calc non Af Amer: >90 mL/min (ref >90)
GLUCOSE: 208 mg/dL — AB (ref 70–99)
Potassium: 3.9 mEq/L (ref 3.7–5.3)
Sodium: 136 mEq/L — ABNORMAL LOW (ref 137–147)

## 2014-07-28 LAB — TROPONIN I: Troponin I: <0.3 ng/mL (ref <0.30)

## 2014-07-28 MED ORDER — ONDANSETRON 4 MG PO TBDP: ORAL_TABLET | ORAL | Status: DC

## 2014-07-29 ENCOUNTER — Inpatient Hospital Stay (HOSPITAL_COMMUNITY)
Admission: EM | Admit: 2014-07-29 | Discharge: 2014-07-31 | DRG: 313 | Disposition: A | Discharge status: Home / Self Care | Payer: Managed Care, Other (non HMO) | Attending: Internal Medicine | Admitting: Internal Medicine

## 2014-07-29 ENCOUNTER — Emergency Department (HOSPITAL_COMMUNITY): Payer: Managed Care, Other (non HMO)

## 2014-07-29 ENCOUNTER — Encounter (HOSPITAL_COMMUNITY): Payer: Self-pay | Admitting: Emergency Medicine

## 2014-07-29 DIAGNOSIS — E669 Obesity, unspecified: Secondary | ICD-10-CM | POA: Diagnosis present

## 2014-07-29 DIAGNOSIS — E119 Type 2 diabetes mellitus without complications: Secondary | ICD-10-CM | POA: Diagnosis present

## 2014-07-29 DIAGNOSIS — Z79899 Other long term (current) drug therapy: Secondary | ICD-10-CM

## 2014-07-29 DIAGNOSIS — I10 Essential (primary) hypertension: Secondary | ICD-10-CM | POA: Diagnosis present

## 2014-07-29 DIAGNOSIS — Z9119 Patient's noncompliance with other medical treatment and regimen: Secondary | ICD-10-CM | POA: Diagnosis present

## 2014-07-29 DIAGNOSIS — R112 Nausea with vomiting, unspecified: Secondary | ICD-10-CM | POA: Diagnosis present

## 2014-07-29 DIAGNOSIS — R1013 Epigastric pain: Secondary | ICD-10-CM

## 2014-07-29 DIAGNOSIS — I959 Hypotension, unspecified: Secondary | ICD-10-CM | POA: Diagnosis present

## 2014-07-29 DIAGNOSIS — R0789 Other chest pain: Principal | ICD-10-CM | POA: Diagnosis present

## 2014-07-29 DIAGNOSIS — R079 Chest pain, unspecified: Secondary | ICD-10-CM

## 2014-07-29 LAB — CBG MONITORING, ED
Glucose-Capillary: 205 mg/dL — ABNORMAL HIGH (ref 70–99)
Glucose-Capillary: 206 mg/dL — ABNORMAL HIGH (ref 70–99)

## 2014-07-29 LAB — CBC
HEMATOCRIT: 44 % (ref 36.0–46.0)
Hemoglobin: 15.3 g/dL — ABNORMAL HIGH (ref 12.0–15.0)
MCH: 31.9 pg (ref 26.0–34.0)
MCHC: 34.8 g/dL (ref 30.0–36.0)
MCV: 91.7 fL (ref 78.0–100.0)
Platelets: 302 10*3/uL (ref 150–400)
RBC: 4.8 MIL/uL (ref 3.87–5.11)
RDW: 12.9 % (ref 11.5–15.5)
WBC: 8.8 10*3/uL (ref 4.0–10.5)

## 2014-07-29 LAB — HEPATIC FUNCTION PANEL
ALT: 20 U/L (ref 0–35)
AST: 18 U/L (ref 0–37)
Albumin: 4 g/dL (ref 3.5–5.2)
Alkaline Phosphatase: 100 U/L (ref 39–117)
Total Bilirubin: 0.5 mg/dL (ref 0.3–1.2)
Total Protein: 8.1 g/dL (ref 6.0–8.3)

## 2014-07-29 LAB — BASIC METABOLIC PANEL
Anion gap: 19 — ABNORMAL HIGH (ref 5–15)
BUN: 20 mg/dL (ref 6–23)
CHLORIDE: 95 meq/L — AB (ref 96–112)
CO2: 22 mEq/L (ref 19–32)
CREATININE: 0.86 mg/dL (ref 0.50–1.10)
Calcium: 10.2 mg/dL (ref 8.4–10.5)
GFR calc Af Amer: 88 mL/min — ABNORMAL LOW (ref >90)
GFR calc non Af Amer: 76 mL/min — ABNORMAL LOW (ref >90)
Glucose, Bld: 218 mg/dL — ABNORMAL HIGH (ref 70–99)
Potassium: 3.8 mEq/L (ref 3.7–5.3)
Sodium: 136 mEq/L — ABNORMAL LOW (ref 137–147)

## 2014-07-29 LAB — PRO B NATRIURETIC PEPTIDE: Pro B Natriuretic peptide (BNP): 22.6 pg/mL (ref 0–125)

## 2014-07-29 LAB — I-STAT TROPONIN, ED: Troponin i, poc: 0 ng/mL (ref 0.00–0.08)

## 2014-07-29 LAB — LIPASE, BLOOD: Lipase: 16 U/L (ref 11–59)

## 2014-07-29 MED ORDER — LABETALOL HCL 5 MG/ML IV SOLN
10.0000 mg | Freq: Once | INTRAVENOUS | Status: AC
  Administered 2014-07-29: 10 mg via INTRAVENOUS
  Filled 2014-07-29: qty 4

## 2014-07-29 MED ORDER — FENTANYL CITRATE 0.05 MG/ML IJ SOLN
50.0000 ug | Freq: Once | INTRAMUSCULAR | Status: AC
  Administered 2014-07-29: 50 ug via INTRAVENOUS
  Filled 2014-07-29: qty 2

## 2014-07-29 MED ORDER — ASPIRIN 81 MG PO CHEW
324.0000 mg | CHEWABLE_TABLET | Freq: Once | ORAL | Status: AC
  Administered 2014-07-29: 324 mg via ORAL
  Filled 2014-07-29: qty 4

## 2014-07-29 MED ORDER — ONDANSETRON HCL 4 MG/2ML IJ SOLN
4.0000 mg | Freq: Once | INTRAMUSCULAR | Status: AC
  Administered 2014-07-29: 4 mg via INTRAVENOUS
  Filled 2014-07-29: qty 2

## 2014-07-29 MED ORDER — PANTOPRAZOLE SODIUM 40 MG IV SOLR
40.0000 mg | Freq: Once | INTRAVENOUS | Status: AC
  Administered 2014-07-29: 40 mg via INTRAVENOUS
  Filled 2014-07-29: qty 40

## 2014-07-29 MED ORDER — CLONIDINE HCL 0.1 MG PO TABS
0.1000 mg | ORAL_TABLET | Freq: Once | ORAL | Status: AC
  Administered 2014-07-29: 0.1 mg via ORAL
  Filled 2014-07-29: qty 1

## 2014-07-29 MED ORDER — SODIUM CHLORIDE 0.9 % IV BOLUS (SEPSIS)
1000.0000 mL | Freq: Once | INTRAVENOUS | Status: AC
  Administered 2014-07-29: 1000 mL via INTRAVENOUS

## 2014-07-29 MED ORDER — INSULIN ASPART 100 UNIT/ML ~~LOC~~ SOLN
0.0000 [IU] | SUBCUTANEOUS | Status: DC
  Administered 2014-07-29: 5 [IU] via SUBCUTANEOUS
  Administered 2014-07-30 (×2): 2 [IU] via SUBCUTANEOUS
  Administered 2014-07-30: 3 [IU] via SUBCUTANEOUS
  Administered 2014-07-30: 2 [IU] via SUBCUTANEOUS
  Administered 2014-07-31 (×2): 3 [IU] via SUBCUTANEOUS
  Filled 2014-07-29: qty 1

## 2014-07-29 MED ORDER — MORPHINE SULFATE 4 MG/ML IJ SOLN
4.0000 mg | Freq: Once | INTRAMUSCULAR | Status: AC
  Administered 2014-07-29: 4 mg via INTRAVENOUS
  Filled 2014-07-29: qty 1

## 2014-07-29 MED ORDER — METOCLOPRAMIDE HCL 5 MG/ML IJ SOLN
10.0000 mg | Freq: Once | INTRAMUSCULAR | Status: AC
  Administered 2014-07-29: 10 mg via INTRAVENOUS
  Filled 2014-07-29: qty 2

## 2014-07-29 NOTE — ED Notes (Signed)
Pt not in room, currently in radiology

## 2014-07-29 NOTE — H&P (Signed)
[facility] History and Physical  MANILLA STRIETER WKM:[id number] DOB: 10-May-1961    PCP:   Foye Spurling, MD   Chief Complaint: chest tightness.  HPI: Holly Spencer is an 53 y.o. female with hx of obesity, DM on oral hyperglycemic agents, hx of HTN, noncompliance, returned to the ER feeling tightness in her chest and and general malaise.  She was seen for CP with negative work up in the ER, and discharged home a few days ago with cardiology follow up and recommendation for outpatient stress test.  She couldn't see her doctor in time, so with her chest discomfort, she came back tonight.  In the ER, she was found to be hypertensive with SBP over 116, and diastolic over 579.  Her EKG showed no acute ST T changes, and her initial troponin was negative as well.  Her CXR was clear, and her renal fx tests were normal.  Her UA was pending.  She also had hx of diarrhea for three days, with nausea and vomiting, and she was not able to take her meds.  Her diarrhea has since stopped.  Hospitalist was asked to admit her for atypical CP and HTN urgency.  Rewiew of Systems:  Constitutional: Negative for malaise, fever and chills. No significant weight loss or weight gain Eyes: Negative for eye pain, redness and discharge, diplopia, visual changes, or flashes of light. ENMT: Negative for ear pain, hoarseness, nasal congestion, sinus pressure and sore throat. No headaches; tinnitus, drooling, or problem swallowing. Cardiovascular: Negative for palpitations, diaphoresis, dyspnea and peripheral edema. ; No orthopnea, PND Respiratory: Negative for cough, hemoptysis, wheezing and stridor. No pleuritic chestpain. Gastrointestinal: Negative for nausea, vomiting, diarrhea, constipation, abdominal pain, melena, blood in stool, hematemesis, jaundice and rectal bleeding.    Genitourinary: Negative for frequency, dysuria, incontinence,flank pain and hematuria; Musculoskeletal: Negative for back pain and neck  pain. Negative for swelling and trauma.;  Skin: . Negative for pruritus, rash, abrasions, bruising and skin lesion.; ulcerations Neuro: Negative for headache, lightheadedness and neck stiffness. Negative for weakness, altered level of consciousness , altered mental status, extremity weakness, burning feet, involuntary movement, seizure and syncope.  Psych: negative for anxiety, depression, insomnia, tearfulness, panic attacks, hallucinations, paranoia, suicidal or homicidal ideation   Past Medical History  Diagnosis Date  . Diabetes mellitus without complication   . Hypertension     Past Surgical History  Procedure Laterality Date  . Abdominal hysterectomy      Medications:  HOME MEDS: Prior to Admission medications   Medication Sig Start Date End Date Taking? Authorizing Provider  glipiZIDE (GLUCOTROL) 5 MG tablet Take 1 tablet (5 mg total) by mouth 2 (two) times daily before a meal. 08/07/13  Yes Robyn Haber, MD  metFORMIN (GLUCOPHAGE-XR) 750 MG 24 hr tablet Take 1 tablet (750 mg total) by mouth daily with breakfast. 08/07/13  Yes Robyn Haber, MD  ondansetron (ZOFRAN ODT) 4 MG disintegrating tablet 20m ODT q4 hours prn nausea/vomit Patient taking differently: Take 4 mg by mouth every 4 (four) hours as needed for nausea or vomiting. 440mODT q4 hours prn nausea/vomit 07/28/14  Yes JaMerrily PewMD  valsartan-hydrochlorothiazide (DIOVAN-HCT) 320-12.5 MG per tablet Take 1 tablet by mouth daily. 08/07/13  Yes KuRobyn HaberMD  Blood Glucose Monitoring Suppl (BLOOD GLUCOSE METER) kit Use as instructed 05/25/12   SaMancel BalePA-C  FINGERSTIX LANCETS MISC To check blood sugar tid/ 250.00 dx code uncontrolled diabetes 08/07/13   KuRobyn HaberMD     Allergies:  No Known Allergies  Social History:   reports that she has never smoked. She has never used smokeless tobacco. She reports that she does not drink alcohol or use illicit drugs.  Family History: History reviewed.  No pertinent family history.   Physical Exam: Filed Vitals:   07/29/14 2030 07/29/14 2135 07/29/14 2200 07/29/14 2300  BP: 227/87 228/88 199/91 221/105  Pulse: 61 62 66 71  Temp:      TempSrc:      Resp: 19     SpO2: 100% 99% 100% 100%   Blood pressure 221/105, pulse 71, temperature 98.4 F (36.9 C), temperature source Oral, resp. rate 19, SpO2 100 %.  GEN:  Pleasant  patient lying in the stretcher in no acute distress; cooperative with exam. PSYCH:  alert and oriented x4; does not appear anxious or depressed; affect is appropriate. HEENT: Mucous membranes pink and anicteric; PERRLA; EOM intact; no cervical lymphadenopathy nor thyromegaly or carotid bruit; no JVD; There were no stridor. Neck is very supple. Breasts:: Not examined CHEST WALL: No tenderness CHEST: Normal respiration, clear to auscultation bilaterally.  HEART: Regular rate and rhythm.  There was a 2/6 SEM at the LSB with no rub, or gallops.   BACK: No kyphosis or scoliosis; no CVA tenderness ABDOMEN: soft and non-tender; no masses, no organomegaly, normal abdominal bowel sounds; no pannus; no intertriginous candida. There is no rebound and no distention. Rectal Exam: Not done EXTREMITIES: No bone or joint deformity; age-appropriate arthropathy of the hands and knees; no edema; no ulcerations.  There is no calf tenderness. Genitalia: not examined PULSES: 2+ and symmetric SKIN: Normal hydration no rash or ulceration CNS: Cranial nerves 2-12 grossly intact no focal lateralizing neurologic deficit.  Speech is fluent; uvula elevated with phonation, facial symmetry and tongue midline. DTR are normal bilaterally, cerebella exam is intact, barbinski is negative and strengths are equaled bilaterally.  No sensory loss.   Labs on Admission:  Basic Metabolic Panel:  Recent Labs Lab 07/27/14 2127 07/28/14 0105 07/29/14 2015  NA 134* 136* 136*  K 4.1 3.9 3.8  CL 91* 94* 95*  CO2 _0 GLUCOSE 236* 208* 218*  BUN _1 CREATININE 0.55 0.77 0.86  CALCIUM 10.8* 10.0 10.2   Liver Function Tests:  Recent Labs Lab 07/29/14 2015  AST 18  ALT 20  ALKPHOS 100  BILITOT 0.5  PROT 8.1  ALBUMIN 4.0    Recent Labs Lab 07/29/14 2015  LIPASE 16   No results for input(s): AMMONIA in the last 168 hours. CBC:  Recent Labs Lab 07/27/14 2127 07/29/14 2015  WBC 13.4* 8.8  HGB 15.2* 15.3*  HCT 43.7 44.0  MCV 89.2 91.7  PLT 333 302   Cardiac Enzymes:  Recent Labs Lab 07/28/14 0105  TROPONINI <0.30    CBG:  Recent Labs Lab 07/27/14 2123 07/29/14 2110  GLUCAP 239* 206*     Radiological Exams on Admission: Dg Chest 2 View  07/29/2014   CLINICAL DATA:  Left-sided chest pain with abdominal pain in the epigastric region. Nausea and vomiting.  EXAM: CHEST  2 VIEW  COMPARISON:  07/27/2014  FINDINGS: Lungs are clear. Cardiomediastinal silhouette and remainder of the exam is unchanged.  IMPRESSION: No active cardiopulmonary disease.   Electronically Signed   By: Marin Olp M.D.   On: 07/29/2014 20:21    EKG: Independently reviewed.  NSR with no acute ST T changes.    Assessment/Plan Present on Admission:  . Hypertension, uncontrolled .  Chest pain, atypical . Nausea and vomiting . HTN (hypertension), malignant  PLAN:  Will admit her for atypical chest pain, r/out with severe HTN and recent hx of diarrhea, nausea and vomiting.  I don't think she has ACS.  I suspect her BP was due to her nausea and vomiting, unable to take her medications.  She has been known to be non compliant in the past as well.  Will resume her meds with anti emetics, use IV labetelol PRN for severe HTN.  For her HTN, will continue her valsartan, but will hold HCTZ.  She will have an ECHO, and cycling of her troponins.  I think her GI symptoms were of viral origin, and it is resolving already.  When she is better, she will need to have a stress test and a polysomnogram.   She is stable, full code, and will be admitted  to telemetry under hospitalist service. Thank you for allowing me to participate in the care of your patient.  Other plans as per orders.  Code Status: FULL Haskel Khan, MD. [facility] Pager [phone] 7pm to 7am.  07/29/2014, 11:26 PM

## 2014-07-29 NOTE — ED Provider Notes (Signed)
CSN: [id number]     Arrival date & time 07/29/14  1900 History   First MD Initiated Contact with Patient 07/29/14 2022     Chief Complaint  Patient presents with  . Chest Pain  . Hypertension     (Consider location/radiation/quality/duration/timing/severity/associated sxs/prior Treatment) HPI Comments: Patient with a history of diabetes and hypertension presents with chest pain. She describes a left-sided chest pain that's been going on for about 5 days intermittently. She has associated shortness of breath. It does not seem to be exertional in nature. She states the pain is an aching pain in the left side of her chest and occasionally radiates to her left arm. She denies any past history of cardiac problems. She does have a history of hypertension and states her blood pressures been not well controlled recently. She states she's had about a week history of vomiting and diarrhea. The diarrhea stopped about 3 days ago but the vomiting continued. She's having a hard time keeping her medications down. She's had some discomfort across her epigastric and upper abdomen. She was seen here 2 days ago for the same complaints. She states she's not any better and she was unable to get outpatient follow-up. Her primary care physician was unable to see her until after the holidays.  Patient is a 53 y.o. female presenting with chest pain and hypertension.  Chest Pain Associated symptoms: nausea, shortness of breath and vomiting   Associated symptoms: no abdominal pain, no back pain, no cough, no diaphoresis, no dizziness, no fatigue, no fever, no headache, no numbness and no weakness   Hypertension Associated symptoms include chest pain and shortness of breath. Pertinent negatives include no abdominal pain and no headaches.    Past Medical History  Diagnosis Date  . Diabetes mellitus without complication   . Hypertension    Past Surgical History  Procedure Laterality Date  . Abdominal hysterectomy      No family history on file. History  Substance Use Topics  . Smoking status: Never Smoker   . Smokeless tobacco: Never Used  . Alcohol Use: No   OB History    No data available     Review of Systems  Constitutional: Negative for fever, chills, diaphoresis and fatigue.  HENT: Negative for congestion, rhinorrhea and sneezing.   Eyes: Negative.   Respiratory: Positive for shortness of breath. Negative for cough and chest tightness.   Cardiovascular: Positive for chest pain. Negative for leg swelling.  Gastrointestinal: Positive for nausea, vomiting and diarrhea. Negative for abdominal pain and blood in stool.  Genitourinary: Negative for frequency, hematuria, flank pain and difficulty urinating.  Musculoskeletal: Negative for back pain and arthralgias.  Skin: Negative for rash.  Neurological: Negative for dizziness, speech difficulty, weakness, numbness and headaches.      Allergies  Review of patient's allergies indicates no known allergies.  Home Medications   Prior to Admission medications   Medication Sig Start Date End Date Taking? Authorizing Provider  glipiZIDE (GLUCOTROL) 5 MG tablet Take 1 tablet (5 mg total) by mouth 2 (two) times daily before a meal. 08/07/13  Yes Robyn Haber, MD  metFORMIN (GLUCOPHAGE-XR) 750 MG 24 hr tablet Take 1 tablet (750 mg total) by mouth daily with breakfast. 08/07/13  Yes Robyn Haber, MD  ondansetron (ZOFRAN ODT) 4 MG disintegrating tablet 4mg  ODT q4 hours prn nausea/vomit Patient taking differently: Take 4 mg by mouth every 4 (four) hours as needed for nausea or vomiting. 4mg  ODT q4 hours prn nausea/vomit 07/28/14  Yes  Marily Memos, MD  valsartan-hydrochlorothiazide (DIOVAN-HCT) 320-12.5 MG per tablet Take 1 tablet by mouth daily. 08/07/13  Yes Elvina Sidle, MD  Blood Glucose Monitoring Suppl (BLOOD GLUCOSE METER) kit Use as instructed 05/25/12   Morrell Riddle, PA-C  FINGERSTIX LANCETS MISC To check blood sugar tid/ 250.00 dx  code uncontrolled diabetes 08/07/13   Elvina Sidle, MD   BP 199/91 mmHg  Pulse 66  Temp(Src) 98.4 F (36.9 C) (Oral)  Resp 19  SpO2 100% Physical Exam  Constitutional: She is oriented to person, place, and time. She appears well-developed and well-nourished.  HENT:  Head: Normocephalic and atraumatic.  Eyes: Pupils are equal, round, and reactive to light.  Neck: Normal range of motion. Neck supple.  Cardiovascular: Normal rate, regular rhythm and normal heart sounds.   Pulmonary/Chest: Effort normal and breath sounds normal. No respiratory distress. She has no wheezes. She has no rales. She exhibits tenderness (mild reproducible chest wall tenderness).  Abdominal: Soft. Bowel sounds are normal. There is tenderness (+tenderness epigastrium). There is no rebound and no guarding.  Musculoskeletal: Normal range of motion. She exhibits no edema.  Lymphadenopathy:    She has no cervical adenopathy.  Neurological: She is alert and oriented to person, place, and time.  Skin: Skin is warm and dry. No rash noted.  Psychiatric: She has a normal mood and affect.    ED Course  Procedures (including critical care time) Labs Review Labs Reviewed  CBC - Abnormal; Notable for the following:    Hemoglobin 15.3 (*)    All other components within normal limits  BASIC METABOLIC PANEL - Abnormal; Notable for the following:    Sodium 136 (*)    Chloride 95 (*)    Glucose, Bld 218 (*)    GFR calc non Af Amer 76 (*)    GFR calc Af Amer 88 (*)    Anion gap 19 (*)    All other components within normal limits  CBG MONITORING, ED - Abnormal; Notable for the following:    Glucose-Capillary 206 (*)    All other components within normal limits  PRO B NATRIURETIC PEPTIDE  HEPATIC FUNCTION PANEL  LIPASE, BLOOD  URINALYSIS, ROUTINE W REFLEX MICROSCOPIC  I-STAT TROPOININ, ED    Imaging Review Dg Chest 2 View  07/29/2014   CLINICAL DATA:  Left-sided chest pain with abdominal pain in the epigastric  region. Nausea and vomiting.  EXAM: CHEST  2 VIEW  COMPARISON:  07/27/2014  FINDINGS: Lungs are clear. Cardiomediastinal silhouette and remainder of the exam is unchanged.  IMPRESSION: No active cardiopulmonary disease.   Electronically Signed   By: Elberta Fortis M.D.   On: 07/29/2014 20:21     EKG Interpretation   Date/Time:  Friday July 29 2014 19:19:06 EST Ventricular Rate:  66 PR Interval:  128 QRS Duration: 84 QT Interval:  406 QTC Calculation: 425 R Axis:   40 Text Interpretation:  Normal sinus rhythm T wave abnormality, consider  inferior ischemia Abnormal ECG Confirmed by Jaycob Mcclenton  MD, Mykenzie Ebanks ([id number]) on  07/29/2014 8:29:23 PM      MDM   Final diagnoses:  Other chest pain  Epigastric pain  Essential hypertension    Patient history diabetes and hypertension presents with left-sided chest pain. She is currently pain-free. She has no definite ischemic changes on EKG. Her troponin is negative. This is her second visit for chest pain and she does have risk factors. Given this I feel she should be admitted for further cardiac evaluation. Her  blood pressure is uncontrolled. She hasn't been able to keep down her blood pressure medicines. She was given dose of clonidine in the ED. She does have this upper abdominal pain. Her liver enzymes are normal as well as her lipase. This could represent gastritis versus gastroparesis. She doesn't have isolated tenderness over her gallbladder. She was given Protonix and Zofran in the ED as well as IV fluids. I consulted Dr. Nicoletta Dress who will admit the patient.    Malvin Johns, MD 07/29/14 [phone]

## 2014-07-29 NOTE — ED Notes (Signed)
Pt placed in a gown with pulse ox, BP cuff and cardiac monitoring continuously

## 2014-07-29 NOTE — ED Notes (Signed)
Pt. reports left lower chest pain with SOB , emesis and elevated blood pressure onset this week .

## 2014-07-29 NOTE — ED Notes (Signed)
Verified insulin dose with Janett BillowJennaya, RN.

## 2014-07-29 NOTE — ED Notes (Signed)
CBG 205 

## 2014-07-30 DIAGNOSIS — I517 Cardiomegaly: Secondary | ICD-10-CM

## 2014-07-30 DIAGNOSIS — I950 Idiopathic hypotension: Secondary | ICD-10-CM

## 2014-07-30 LAB — URINALYSIS, ROUTINE W REFLEX MICROSCOPIC
GLUCOSE, UA: NEGATIVE mg/dL
Hgb urine dipstick: NEGATIVE
Ketones, ur: NEGATIVE mg/dL
Nitrite: NEGATIVE
PH: 5.5 (ref 5.0–8.0)
Protein, ur: NEGATIVE mg/dL
SPECIFIC GRAVITY, URINE: 1.025 (ref 1.005–1.030)
Urobilinogen, UA: 0.2 mg/dL (ref 0.0–1.0)

## 2014-07-30 LAB — GLUCOSE, CAPILLARY
GLUCOSE-CAPILLARY: 120 mg/dL — AB (ref 70–99)
GLUCOSE-CAPILLARY: 121 mg/dL — AB (ref 70–99)
Glucose-Capillary: 132 mg/dL — ABNORMAL HIGH (ref 70–99)
Glucose-Capillary: 139 mg/dL — ABNORMAL HIGH (ref 70–99)
Glucose-Capillary: 153 mg/dL — ABNORMAL HIGH (ref 70–99)
Glucose-Capillary: 198 mg/dL — ABNORMAL HIGH (ref 70–99)

## 2014-07-30 LAB — CBC
HCT: 39.2 % (ref 36.0–46.0)
Hemoglobin: 13.1 g/dL (ref 12.0–15.0)
MCH: 30 pg (ref 26.0–34.0)
MCHC: 33.4 g/dL (ref 30.0–36.0)
MCV: 89.9 fL (ref 78.0–100.0)
Platelets: 268 10*3/uL (ref 150–400)
RBC: 4.36 MIL/uL (ref 3.87–5.11)
RDW: 12.8 % (ref 11.5–15.5)
WBC: 9.9 10*3/uL (ref 4.0–10.5)

## 2014-07-30 LAB — CREATININE, SERUM
Creatinine, Ser: 0.73 mg/dL (ref 0.50–1.10)
GFR calc Af Amer: >90 mL/min (ref >90)
GFR calc non Af Amer: >90 mL/min (ref >90)

## 2014-07-30 LAB — URINE MICROSCOPIC-ADD ON

## 2014-07-30 LAB — TROPONIN I: Troponin I: <0.3 ng/mL (ref <0.30)

## 2014-07-30 LAB — TSH: TSH: 1.14 u[IU]/mL (ref 0.350–4.500)

## 2014-07-30 MED ORDER — GLIPIZIDE 5 MG PO TABS
5.0000 mg | ORAL_TABLET | Freq: Two times a day (BID) | ORAL | Status: DC
  Administered 2014-07-30 – 2014-07-31 (×3): 5 mg via ORAL
  Filled 2014-07-30 (×5): qty 1

## 2014-07-30 MED ORDER — IRBESARTAN 300 MG PO TABS
300.0000 mg | ORAL_TABLET | Freq: Every day | ORAL | Status: DC
  Filled 2014-07-30: qty 1

## 2014-07-30 MED ORDER — HEPARIN SODIUM (PORCINE) 5000 UNIT/ML IJ SOLN
5000.0000 [IU] | Freq: Three times a day (TID) | INTRAMUSCULAR | Status: DC
  Administered 2014-07-30 – 2014-07-31 (×4): 5000 [IU] via SUBCUTANEOUS
  Filled 2014-07-30 (×7): qty 1

## 2014-07-30 MED ORDER — ONDANSETRON HCL 4 MG/2ML IJ SOLN: 4.0000 mg | Freq: Four times a day (QID) | INTRAMUSCULAR | Status: DC | PRN

## 2014-07-30 MED ORDER — SODIUM CHLORIDE 0.9 % IV BOLUS (SEPSIS)
500.0000 mL | Freq: Once | INTRAVENOUS | Status: AC
  Administered 2014-07-30: 500 mL via INTRAVENOUS

## 2014-07-30 MED ORDER — SODIUM CHLORIDE 0.9 % IJ SOLN
3.0000 mL | Freq: Two times a day (BID) | INTRAMUSCULAR | Status: DC
  Administered 2014-07-30: 3 mL via INTRAVENOUS

## 2014-07-30 MED ORDER — IRBESARTAN 300 MG PO TABS
300.0000 mg | ORAL_TABLET | Freq: Every day | ORAL | Status: DC
  Administered 2014-07-31: 300 mg via ORAL
  Filled 2014-07-30: qty 1

## 2014-07-30 MED ORDER — ASPIRIN EC 325 MG PO TBEC
325.0000 mg | DELAYED_RELEASE_TABLET | Freq: Every day | ORAL | Status: DC
  Administered 2014-07-30 – 2014-07-31 (×2): 325 mg via ORAL
  Filled 2014-07-30 (×2): qty 1

## 2014-07-30 MED ORDER — METFORMIN HCL ER 750 MG PO TB24
750.0000 mg | ORAL_TABLET | Freq: Every day | ORAL | Status: DC
  Administered 2014-07-30 – 2014-07-31 (×2): 750 mg via ORAL
  Filled 2014-07-30 (×3): qty 1

## 2014-07-30 MED ORDER — ONDANSETRON HCL 4 MG PO TABS: 4.0000 mg | ORAL_TABLET | Freq: Four times a day (QID) | ORAL | Status: DC | PRN

## 2014-07-30 MED ORDER — LABETALOL HCL 5 MG/ML IV SOLN
10.0000 mg | INTRAVENOUS | Status: DC | PRN
  Filled 2014-07-30: qty 4

## 2014-07-30 MED ORDER — MORPHINE SULFATE 4 MG/ML IJ SOLN: 4.0000 mg | INTRAMUSCULAR | Status: DC | PRN

## 2014-07-30 NOTE — Progress Notes (Signed)
*  PRELIMINARY RESULTS*.  Janalyn HarderWest, Gorden Stthomas R 07/30/2014, 11:09 AM

## 2014-07-30 NOTE — Progress Notes (Signed)
PROGRESS NOTE  Aldean AstLisa M Kirschenbaum BJY:[id number] DOB: 1961-05-22 DOA: 07/29/2014 PCP: Laurena SlimmerLARK,PRESTON S, MD  Assessment/Plan: N/V/D- improved, most likely viral in origin - IVF  Chest pain -echo pending -outpatient stress test -CE enzymes negative so far  Hypotension- holding parameters on BP meds -IVF  Obesity -outpatient sleep study  Code Status: full Family Communication: patient Disposition Plan:    Consultants:    Procedures:      HPI/Subjective: Patient feeling much better  Objective: Filed Vitals:   07/30/14 0436  BP: 92/57  Pulse: 70  Temp: 98.6 F (37 C)  Resp: 18    Intake/Output Summary (Last 24 hours) at 07/30/14 1021 Last data filed at 07/30/14 0900  Gross per 24 hour  Intake    120 ml  Output      0 ml  Net    120 ml   Filed Weights   07/30/14 0036  Weight: 90.719 kg (200 lb)    Exam:   General:  A+Ox3, NAD  Cardiovascular: rrr  Respiratory: clear  Abdomen: +Bs, soft   Data Reviewed: Basic Metabolic Panel:  Recent Labs Lab 07/27/14 2127 07/28/14 0105 07/29/14 2015 07/30/14 0111  NA 134* 136* 136*  --   K 4.1 3.9 3.8  --   CL 91* 94* 95*  --   CO2 22 24 22   --   GLUCOSE 236* 208* 218*  --   BUN 10 12 20   --   CREATININE 0.55 0.77 0.86 0.73  CALCIUM 10.8* 10.0 10.2  --    Liver Function Tests:  Recent Labs Lab 07/29/14 2015  AST 18  ALT 20  ALKPHOS 100  BILITOT 0.5  PROT 8.1  ALBUMIN 4.0    Recent Labs Lab 07/29/14 2015  LIPASE 16   No results for input(s): AMMONIA in the last 168 hours. CBC:  Recent Labs Lab 07/27/14 2127 07/29/14 2015 07/30/14 0111  WBC 13.4* 8.8 9.9  HGB 15.2* 15.3* 13.1  HCT 43.7 44.0 39.2  MCV 89.2 91.7 89.9  PLT 333 302 268   Cardiac Enzymes:  Recent Labs Lab 07/28/14 0105 07/30/14 0111 07/30/14 0605  TROPONINI <0.30 <0.30 <0.30   BNP (last 3 results)  Recent Labs  07/27/14 2127 07/29/14 1918  PROBNP 575.0* 22.6   CBG:  Recent Labs Lab  07/29/14 2110 07/29/14 2341 07/30/14 0040 07/30/14 0433 07/30/14 0804  GLUCAP 206* 205* 198* 139* 132*    No results found for this or any previous visit (from the past 240 hour(s)).   Studies: Dg Chest 2 View  07/29/2014   CLINICAL DATA:  Left-sided chest pain with abdominal pain in the epigastric region. Nausea and vomiting.  EXAM: CHEST  2 VIEW  COMPARISON:  07/27/2014  FINDINGS: Lungs are clear. Cardiomediastinal silhouette and remainder of the exam is unchanged.  IMPRESSION: No active cardiopulmonary disease.   Electronically Signed   By: Elberta Fortisaniel  Boyle M.D.   On: 07/29/2014 20:21    Scheduled Meds: . aspirin EC  325 mg Oral Daily  . glipiZIDE  5 mg Oral BID AC  . heparin  5,000 Units Subcutaneous 3 times per day  . insulin aspart  0-15 Units Subcutaneous 6 times per day  . irbesartan  300 mg Oral Daily  . metFORMIN  750 mg Oral Q breakfast  . sodium chloride  3 mL Intravenous Q12H   Continuous Infusions:  Antibiotics Given (last 72 hours)    None      Active Problems:   Diabetes mellitus, type II  Hypertension, uncontrolled   Chest pain, atypical   Nausea and vomiting   HTN (hypertension), malignant    Time spent: 35 min    VANN, JESSICA  [facility] Pager [phone]. If 7PM-7AM, please contact night-coverage at [email or web address], password [email or web address] 07/30/2014, 10:21 AM  LOS: 1 day

## 2014-07-31 LAB — GLUCOSE, CAPILLARY
GLUCOSE-CAPILLARY: 151 mg/dL — AB (ref 70–99)
Glucose-Capillary: 76 mg/dL (ref 70–99)
Glucose-Capillary: 165 mg/dL — ABNORMAL HIGH (ref 70–99)

## 2014-07-31 NOTE — Discharge Summary (Signed)
Physician Discharge Summary  Holly Spencer:[id number] DOB: [birth year] DOA: 07/29/2014  PCP: Laurena Slimmer, MD  Admit date: 07/29/2014 Discharge date: 07/31/2014  Time spent: 35 minutes  Recommendations for Outpatient Follow-up:  1. Outpatient stress test 2. Outpatient sleep study  Discharge Diagnoses:  Active Problems:   Diabetes mellitus, type II   Hypertension, uncontrolled   Chest pain, atypical   Nausea and vomiting   HTN (hypertension), malignant   Discharge Condition: improved  Diet recommendation: cardiac/diabetic  Filed Weights   07/30/14 0036  Weight: 90.719 kg (200 lb)    History of present illness:  Holly Spencer is an 53 y.o. female with hx of obesity, DM on oral hyperglycemic agents, hx of HTN, noncompliance, returned to the ER feeling tightness in her chest and and general malaise. She was seen for CP with negative work up in the ER, and discharged home a few days ago with cardiology follow up and recommendation for outpatient stress test. She couldn't see her doctor in time, so with her chest discomfort, she came back tonight. In the ER, she was found to be hypertensive with SBP over 200, and diastolic over 110. Her EKG showed no acute ST T changes, and her initial troponin was negative as well. Her CXR was clear, and her renal fx tests were normal. Her UA was pending. She also had hx of diarrhea for three days, with nausea and vomiting, and she was not able to take her meds. Her diarrhea has since stopped. Hospitalist was asked to admit her for atypical CP and HTN urgency.  Hospital Course:  N/V/D- improved, most likely viral in origin - resolved  Chest pain -echo: Study Conclusions - Left ventricle: The cavity size was normal. Wall thickness was normal. The estimated ejection fraction was 60%. Wall motion was normal; there were no regional wall motion abnormalities. - Left atrium: The atrium was mildly dilated. - Right ventricle:  The cavity size was normal. Systolic function was normal. -outpatient stress test -CE enzymes negative   Hypotension-  -resolved with IVF  Obesity -outpatient sleep study  Procedures:  Echo  Consultations:    Discharge Exam: Filed Vitals:   07/31/14 0425  BP: 136/63  Pulse: 64  Temp: 98.3 F (36.8 C)  Resp: 18    General: A+Ox3, NAD Cardiovascular: rrr Respiratory: clear  Discharge Instructions You were cared for by a hospitalist during your hospital stay. If you have any questions about your discharge medications or the care you received while you were in the hospital after you are discharged, you can call the unit and asked to speak with the hospitalist on call if the hospitalist that took care of you is not available. Once you are discharged, your primary care physician will handle any further medical issues. Please note that NO REFILLS for any discharge medications will be authorized once you are discharged, as it is imperative that you return to your primary care physician (or establish a relationship with a primary care physician if you do not have one) for your aftercare needs so that they can reassess your need for medications and monitor your lab values.  Discharge Instructions    Diet - low sodium heart healthy    Complete by:  As directed      Diet Carb Modified    Complete by:  As directed      Discharge instructions    Complete by:  As directed   Monitor BP at home Outpatient stress test May return to  work on 12/16     Increase activity slowly    Complete by:  As directed           Current Discharge Medication List    CONTINUE these medications which have NOT CHANGED   Details  glipiZIDE (GLUCOTROL) 5 MG tablet Take 1 tablet (5 mg total) by mouth 2 (two) times daily before a meal. Qty: 60 tablet, Refills: 3   Associated Diagnoses: Type II or unspecified type diabetes mellitus without mention of complication, not stated as uncontrolled;  Hyperglycemia; Diabetes    metFORMIN (GLUCOPHAGE-XR) 750 MG 24 hr tablet Take 1 tablet (750 mg total) by mouth daily with breakfast. Qty: 90 tablet, Refills: 2   Associated Diagnoses: Diabetes    ondansetron (ZOFRAN ODT) 4 MG disintegrating tablet 60m ODT q4 hours prn nausea/vomit Qty: 10 tablet, Refills: 0    valsartan-hydrochlorothiazide (DIOVAN-HCT) 320-12.5 MG per tablet Take 1 tablet by mouth daily. Qty: 90 tablet, Refills: 2   Associated Diagnoses: Hypertension    Blood Glucose Monitoring Suppl (BLOOD GLUCOSE METER) kit Use as instructed Qty: 1 each, Refills: 0    FINGERSTIX LANCETS MISC To check blood sugar tid/ 250.00 dx code uncontrolled diabetes Qty: 1 each, Refills: 0   Associated Diagnoses: Diabetes       No Known Allergies    The results of significant diagnostics from this hospitalization (including imaging, microbiology, ancillary and laboratory) are listed below for reference.    Significant Diagnostic Studies: Dg Chest 2 View  07/29/2014   CLINICAL DATA:  Left-sided chest pain with abdominal pain in the epigastric region. Nausea and vomiting.  EXAM: CHEST  2 VIEW  COMPARISON:  07/27/2014  FINDINGS: Lungs are clear. Cardiomediastinal silhouette and remainder of the exam is unchanged.  IMPRESSION: No active cardiopulmonary disease.   Electronically Signed   By: DMarin OlpM.D.   On: 07/29/2014 20:21   Dg Chest 2 View  07/27/2014   CLINICAL DATA:  Left side chest pain and shortness of Breath for couple of days  EXAM: CHEST  2 VIEW  COMPARISON:  01/11/2014  FINDINGS: Cardiomediastinal silhouette is stable. No acute infiltrate or pulmonary edema. 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 Mild degenerative changes thoracic spine.  IMPRESSION: No active cardiopulmonary disease.   Electronically Signed   By: LLahoma CrockerM.D.   On: 07/27/2014 22:09    Microbiology: No results found for this or any previous visit (from the past 240 hour(s)).   Labs: Basic  Metabolic Panel:  Recent Labs Lab 07/27/14 2127 07/28/14 0105 07/29/14 2015 07/30/14 0111  NA 134* 136* 136*  --   K 4.1 3.9 3.8  --   CL 91* 94* 95*  --   CO2 _0 --   GLUCOSE 236* 208* 218*  --   BUN _1 --   CREATININE 0.55 0.77 0.86 0.73  CALCIUM 10.8* 10.0 10.2  --    Liver Function Tests:  Recent Labs Lab 07/29/14 2015  AST 18  ALT 20  ALKPHOS 100  BILITOT 0.5  PROT 8.1  ALBUMIN 4.0    Recent Labs Lab 07/29/14 2015  LIPASE 16   No results for input(s): AMMONIA in the last 168 hours. CBC:  Recent Labs Lab 07/27/14 2127 07/29/14 2015 07/30/14 0111  WBC 13.4* 8.8 9.9  HGB 15.2* 15.3* 13.1  HCT 43.7 44.0 39.2  MCV 89.2 91.7 89.9  PLT 333 302 268   Cardiac Enzymes:  Recent Labs Lab 07/28/14  0105 07/30/14 0111 07/30/14 0605 07/30/14 1222  TROPONINI <0.30 <0.30 <0.30 <0.30   BNP: BNP (last 3 results)  Recent Labs  07/27/14 2127 07/29/14 1918  PROBNP 575.0* 22.6   CBG:  Recent Labs Lab 07/30/14 1654 07/30/14 2042 07/31/14 0106 07/31/14 0423 07/31/14 0757  GLUCAP 121* 153* 76 151* 165*       Signed:  Pasco Marchitto  [facility] 07/31/2014, 8:27 AM

## 2014-07-31 NOTE — Progress Notes (Signed)
Pt discharged home with husband Discharge instructions given & reviewed Eduction discussed  IV dc'd  Tele dc'd  Pt discharged via wheelchair with RN All pt belongs at side Louie BunWilson,Zakirah Weingart S 12:45 PM

## 2014-10-13 ENCOUNTER — Other Ambulatory Visit: Payer: Self-pay | Admitting: Family Medicine

## 2014-10-14 ENCOUNTER — Other Ambulatory Visit: Payer: Self-pay

## 2014-10-31 ENCOUNTER — Emergency Department (HOSPITAL_COMMUNITY): Payer: Managed Care, Other (non HMO)

## 2014-10-31 ENCOUNTER — Emergency Department (HOSPITAL_COMMUNITY)
Admission: EM | Admit: 2014-10-31 | Discharge: 2014-11-01 | Disposition: A | Discharge status: Home / Self Care | Payer: Managed Care, Other (non HMO) | Attending: Emergency Medicine | Admitting: Emergency Medicine

## 2014-10-31 ENCOUNTER — Encounter (HOSPITAL_COMMUNITY): Payer: Self-pay | Admitting: Emergency Medicine

## 2014-10-31 DIAGNOSIS — R112 Nausea with vomiting, unspecified: Secondary | ICD-10-CM | POA: Diagnosis not present

## 2014-10-31 DIAGNOSIS — Z79899 Other long term (current) drug therapy: Secondary | ICD-10-CM | POA: Insufficient documentation

## 2014-10-31 DIAGNOSIS — R197 Diarrhea, unspecified: Secondary | ICD-10-CM | POA: Diagnosis not present

## 2014-10-31 DIAGNOSIS — I10 Essential (primary) hypertension: Secondary | ICD-10-CM | POA: Diagnosis not present

## 2014-10-31 DIAGNOSIS — R109 Unspecified abdominal pain: Secondary | ICD-10-CM

## 2014-10-31 DIAGNOSIS — Z90710 Acquired absence of both cervix and uterus: Secondary | ICD-10-CM | POA: Insufficient documentation

## 2014-10-31 DIAGNOSIS — R1013 Epigastric pain: Secondary | ICD-10-CM | POA: Insufficient documentation

## 2014-10-31 DIAGNOSIS — E119 Type 2 diabetes mellitus without complications: Secondary | ICD-10-CM | POA: Insufficient documentation

## 2014-10-31 DIAGNOSIS — R1012 Left upper quadrant pain: Secondary | ICD-10-CM | POA: Insufficient documentation

## 2014-10-31 DIAGNOSIS — R079 Chest pain, unspecified: Secondary | ICD-10-CM | POA: Insufficient documentation

## 2014-10-31 LAB — BASIC METABOLIC PANEL
Anion gap: 16 — ABNORMAL HIGH (ref 5–15)
BUN: 26 mg/dL — AB (ref 6–23)
CALCIUM: 10.2 mg/dL (ref 8.4–10.5)
CO2: 21 mmol/L (ref 19–32)
CREATININE: 1.1 mg/dL (ref 0.50–1.10)
Chloride: 100 mmol/L (ref 96–112)
GFR calc Af Amer: 66 mL/min — ABNORMAL LOW (ref >90)
GFR calc non Af Amer: 57 mL/min — ABNORMAL LOW (ref >90)
Glucose, Bld: 254 mg/dL — ABNORMAL HIGH (ref 70–99)
Potassium: 4.1 mmol/L (ref 3.5–5.1)
Sodium: 137 mmol/L (ref 135–145)

## 2014-10-31 LAB — HEPATIC FUNCTION PANEL
ALK PHOS: 113 U/L (ref 39–117)
ALT: 22 U/L (ref 0–35)
AST: 27 U/L (ref 0–37)
Albumin: 4.5 g/dL (ref 3.5–5.2)
BILIRUBIN INDIRECT: 1 mg/dL — AB (ref 0.3–0.9)
BILIRUBIN TOTAL: 1.2 mg/dL (ref 0.3–1.2)
Bilirubin, Direct: 0.2 mg/dL (ref 0.0–0.5)
Total Protein: 8.7 g/dL — ABNORMAL HIGH (ref 6.0–8.3)

## 2014-10-31 LAB — CBC WITH DIFFERENTIAL/PLATELET
BASOS PCT: 0 % (ref 0–1)
Basophils Absolute: 0 10*3/uL (ref 0.0–0.1)
EOS ABS: 0 10*3/uL (ref 0.0–0.7)
Eosinophils Relative: 0 % (ref 0–5)
HCT: 44.3 % (ref 36.0–46.0)
HEMOGLOBIN: 15.1 g/dL — AB (ref 12.0–15.0)
Lymphocytes Relative: 11 % — ABNORMAL LOW (ref 12–46)
Lymphs Abs: 1.4 10*3/uL (ref 0.7–4.0)
MCH: 31.3 pg (ref 26.0–34.0)
MCHC: 34.1 g/dL (ref 30.0–36.0)
MCV: 91.9 fL (ref 78.0–100.0)
Monocytes Absolute: 0.1 10*3/uL (ref 0.1–1.0)
Monocytes Relative: 1 % — ABNORMAL LOW (ref 3–12)
NEUTROS PCT: 88 % — AB (ref 43–77)
Neutro Abs: 10.4 10*3/uL — ABNORMAL HIGH (ref 1.7–7.7)
Platelets: 331 10*3/uL (ref 150–400)
RBC: 4.82 MIL/uL (ref 3.87–5.11)
RDW: 12.9 % (ref 11.5–15.5)
WBC: 11.9 10*3/uL — ABNORMAL HIGH (ref 4.0–10.5)

## 2014-10-31 LAB — LIPASE, BLOOD: Lipase: 62 U/L — ABNORMAL HIGH (ref 11–59)

## 2014-10-31 LAB — I-STAT TROPONIN, ED: Troponin i, poc: 0 ng/mL (ref 0.00–0.08)

## 2014-10-31 LAB — POC OCCULT BLOOD, ED: FECAL OCCULT BLD: POSITIVE — AB

## 2014-10-31 MED ORDER — MORPHINE SULFATE 4 MG/ML IJ SOLN
4.0000 mg | Freq: Once | INTRAMUSCULAR | Status: AC
  Administered 2014-10-31: 4 mg via INTRAVENOUS
  Filled 2014-10-31: qty 1

## 2014-10-31 MED ORDER — HYDROCHLOROTHIAZIDE 12.5 MG PO CAPS: 12.5000 mg | ORAL_CAPSULE | Freq: Once | ORAL | Status: DC

## 2014-10-31 MED ORDER — SODIUM CHLORIDE 0.9 % IV BOLUS (SEPSIS)
1000.0000 mL | Freq: Once | INTRAVENOUS | Status: AC
  Administered 2014-10-31: 1000 mL via INTRAVENOUS

## 2014-10-31 MED ORDER — IRBESARTAN 300 MG PO TABS
300.0000 mg | ORAL_TABLET | Freq: Once | ORAL | Status: AC
  Administered 2014-11-01: 300 mg via ORAL
  Filled 2014-10-31: qty 1

## 2014-10-31 MED ORDER — VALSARTAN-HYDROCHLOROTHIAZIDE 320-12.5 MG PO TABS: 1.0000 | ORAL_TABLET | Freq: Once | ORAL | Status: DC

## 2014-10-31 MED ORDER — ONDANSETRON HCL 4 MG/2ML IJ SOLN
4.0000 mg | Freq: Once | INTRAMUSCULAR | Status: AC
  Administered 2014-10-31: 4 mg via INTRAVENOUS
  Filled 2014-10-31: qty 2

## 2014-10-31 MED ORDER — METOCLOPRAMIDE HCL 5 MG/ML IJ SOLN
10.0000 mg | INTRAMUSCULAR | Status: DC
  Filled 2014-10-31 (×2): qty 2

## 2014-10-31 MED ORDER — METOCLOPRAMIDE HCL 5 MG/ML IJ SOLN
10.0000 mg | Freq: Once | INTRAMUSCULAR | Status: AC
  Administered 2014-10-31: 10 mg via INTRAVENOUS

## 2014-10-31 NOTE — ED Notes (Signed)
Pt c/o N/V x 6 days with mildly elevated CBG; pt sts CP with palpitations today on left side; pt  sts worse with inspiration

## 2014-10-31 NOTE — ED Notes (Signed)
Pt in restroom 

## 2014-10-31 NOTE — ED Notes (Signed)
Patient arrived to room.

## 2014-10-31 NOTE — ED Provider Notes (Signed)
CSN: [id number]     Arrival date & time 10/31/14  1848 History   First MD Initiated Contact with Patient 10/31/14 1950     Chief Complaint  Patient presents with  . Chest Pain  . Emesis     (Consider location/radiation/quality/duration/timing/severity/associated sxs/prior Treatment) The history is provided by the patient and medical records.    This is a 54 year old female with past medical history significant for diabetes and hypertension, presenting to the ED for nausea, vomiting, and diarrhea for the past 6 days. No hematemesis, melena, or hematochezia. She states she has been unable to hold down any solid foods or medications. She has been tolerating limited amounts of fluids thus far.  She denies known sick contacts, recent travel, or abnormal food intake.  No recent antibiotics use. Denies fever but endorses sweats and chills.  Patient states earlier today she developed some epigastric abdominal pain which radiated into her chest so she became concerned.  States pain was notably worse after she continued vomiting.  She states this made her concerned and she began to have palpitations.  She denies SOB.  No known cardiac hx.  Patient has never been a smoker.  Patient hypertensive on arrival, VS otherwise stable.  Past Medical History  Diagnosis Date  . Diabetes mellitus without complication   . Hypertension    Past Surgical History  Procedure Laterality Date  . Abdominal hysterectomy     History reviewed. No pertinent family history. History  Substance Use Topics  . Smoking status: Never Smoker   . Smokeless tobacco: Never Used  . Alcohol Use: No   OB History    No data available     Review of Systems  Gastrointestinal: Positive for nausea, vomiting, abdominal pain and diarrhea.  All other systems reviewed and are negative.     Allergies  Review of patient's allergies indicates no known allergies.  Home Medications   Prior to Admission medications   Medication Sig  Start Date End Date Taking? Authorizing Provider  Blood Glucose Monitoring Suppl (BLOOD GLUCOSE METER) kit Use as instructed 05/25/12   Mancel Bale, PA-C  FINGERSTIX LANCETS MISC To check blood sugar tid/ 250.00 dx code uncontrolled diabetes 08/07/13   Robyn Haber, MD  glipiZIDE (GLUCOTROL) 5 MG tablet Take 1 tablet (5 mg total) by mouth 2 (two) times daily before a meal. PATIENT NEEDS OFFICE VISIT FOR ADDITIONAL REFILLS 10/14/14   Robyn Haber, MD  metFORMIN (GLUCOPHAGE-XR) 750 MG 24 hr tablet Take 1 tablet (750 mg total) by mouth daily with breakfast. 08/07/13   Robyn Haber, MD  ondansetron (ZOFRAN ODT) 4 MG disintegrating tablet 4mg  ODT q4 hours prn nausea/vomit Patient taking differently: Take 4 mg by mouth every 4 (four) hours as needed for nausea or vomiting. 4mg  ODT q4 hours prn nausea/vomit 07/28/14   Merrily Pew, MD  valsartan-hydrochlorothiazide (DIOVAN-HCT) 320-12.5 MG per tablet Take 1 tablet by mouth daily. PATIENT NEEDS OFFICE VISIT FOR ADDITIONAL REFILLS 10/14/14   Robyn Haber, MD   BP 173/89 mmHg  Pulse 87  Temp(Src) 97.5 F (36.4 C) (Oral)  Resp 24  SpO2 100%   Physical Exam  Constitutional: She is oriented to person, place, and time. She appears well-developed and well-nourished. No distress.  HENT:  Head: Normocephalic and atraumatic.  Mouth/Throat: Oropharynx is clear and moist.  Mildly dry mucous membranes  Eyes: Conjunctivae and EOM are normal. Pupils are equal, round, and reactive to light.  Neck: Normal range of motion. Neck supple.  Cardiovascular: Normal rate,  regular rhythm and normal heart sounds.   Pulmonary/Chest: Effort normal and breath sounds normal. No respiratory distress. She has no wheezes.  Abdominal: Soft. Bowel sounds are normal. There is tenderness in the epigastric area and left upper quadrant. There is no guarding.  Abdomen soft, nondistended, mild tenderness of epigastrium and left upper quadrant; no peritonitis  Musculoskeletal:  Normal range of motion. She exhibits no edema.  Neurological: She is alert and oriented to person, place, and time.  Skin: Skin is warm and dry. She is not diaphoretic.  Psychiatric: She has a normal mood and affect.  Nursing note and vitals reviewed.   ED Course  Procedures (including critical care time) Labs Review Labs Reviewed  BASIC METABOLIC PANEL - Abnormal; Notable for the following:    Glucose, Bld 254 (*)    BUN 26 (*)    GFR calc non Af Amer 57 (*)    GFR calc Af Amer 66 (*)    Anion gap 16 (*)    All other components within normal limits  CBC WITH DIFFERENTIAL/PLATELET - Abnormal; Notable for the following:    WBC 11.9 (*)    Hemoglobin 15.1 (*)    Neutrophils Relative % 88 (*)    Neutro Abs 10.4 (*)    Lymphocytes Relative 11 (*)    Monocytes Relative 1 (*)    All other components within normal limits  HEPATIC FUNCTION PANEL - Abnormal; Notable for the following:    Total Protein 8.7 (*)    Indirect Bilirubin 1.0 (*)    All other components within normal limits  LIPASE, BLOOD - Abnormal; Notable for the following:    Lipase 62 (*)    All other components within normal limits  POC OCCULT BLOOD, ED - Abnormal; Notable for the following:    Fecal Occult Bld POSITIVE (*)    All other components within normal limits  I-STAT TROPOININ, ED    Imaging Review Dg Chest 2 View  10/31/2014   CLINICAL DATA:  Nausea vomiting diarrhea for 4 days  EXAM: CHEST  2 VIEW  COMPARISON:  07/29/2014  FINDINGS: The heart size and mediastinal contours are within normal limits. Both lungs are clear. The visualized skeletal structures are unremarkable. Stable hypoplasia right fifth rib.  IMPRESSION: No active cardiopulmonary disease.   Electronically Signed   By: Skipper Cliche M.D.   On: 10/31/2014 19:42     EKG Interpretation None      MDM   Final diagnoses:  Abdominal pain, unspecified abdominal location  Nausea vomiting and diarrhea  Chest pain, unspecified chest pain type    54 year old female with nausea, vomiting, and diarrhea for the past 6 days. She denies known sick contacts, travel, or abnormal food intake. No recent antibiotics use.  On exam, patient appears uncomfortable but is nontoxic in appearance. She has some epigastric and LUQ tenderness without rebound or guarding. Her mucous membranes do appear mildly dry.  Labwork as above-- glucose mildly elevated but anion gap and bicarb remain WNL-- clinically not DKA.  Hemoccult was sent by NT, occult blood noted.  Patient denies any blood in stools at home to me, not currently on anti-coagulants.  Rectum does appear irritated without bleeding hemorrhoids or masses seen-- suspect possible fissure/irritation as source of her bleeding given prolonged diarrhea.  H/H stable here-- do not feel she needs emergent work-up at this time.  Patient given IV fluids, pain meds, and anti-emetics with resolution of her symptoms.  She is currently tolerating PO fluids  and graham crackers.  She has had no active vomiting or diarrhea while in the ED.  Repeat abdominal exam is benign.  Suspect viral process. Patient remains hypertensive as she has been unable to hold down her home meds for the past several days.  No signs of end organ damage today, patient was given dose of BP home meds prior to discharge.  Patient will follow-up with her PCP.  Discussed plan with patient, he/she acknowledged understanding and agreed with plan of care.  Return precautions given for new or worsening symptoms.  Case discussed with attending physician, Dr. Colin Rhein, who agrees with assessment and plan of care.  Larene Pickett, PA-C 11/01/14 0109  Debby Freiberg, MD 11/01/14 1710

## 2014-10-31 NOTE — ED Notes (Signed)
Attempted to replace IV x1. Unable to access. Brett CanalesSteve at the bedside attempting.

## 2014-10-31 NOTE — ED Notes (Signed)
PA made aware of patient's pain and nausea.

## 2014-11-01 MED ORDER — DICYCLOMINE HCL 20 MG PO TABS: 20.0000 mg | ORAL_TABLET | Freq: Two times a day (BID) | ORAL | Status: DC

## 2014-11-01 MED ORDER — ONDANSETRON 4 MG PO TBDP: 4.0000 mg | ORAL_TABLET | Freq: Three times a day (TID) | ORAL | Status: DC | PRN

## 2014-11-01 NOTE — Discharge Instructions (Signed)
Take the prescribed medication as directed.  Make sure to continue drinking fluids to help you stay hydrated. Follow-up with your primary care physician. Return to the ED for new or worsening symptoms.

## 2014-11-14 ENCOUNTER — Other Ambulatory Visit: Payer: Self-pay | Admitting: Family Medicine

## 2015-08-11 ENCOUNTER — Emergency Department (HOSPITAL_COMMUNITY): Payer: Managed Care, Other (non HMO)

## 2015-08-11 ENCOUNTER — Encounter (HOSPITAL_COMMUNITY): Payer: Self-pay | Admitting: *Deleted

## 2015-08-11 ENCOUNTER — Emergency Department (HOSPITAL_COMMUNITY)
Admission: EM | Admit: 2015-08-11 | Discharge: 2015-08-11 | Disposition: A | Discharge status: Home / Self Care | Payer: Managed Care, Other (non HMO) | Attending: Emergency Medicine | Admitting: Emergency Medicine

## 2015-08-11 DIAGNOSIS — I10 Essential (primary) hypertension: Secondary | ICD-10-CM | POA: Insufficient documentation

## 2015-08-11 DIAGNOSIS — Z7984 Long term (current) use of oral hypoglycemic drugs: Secondary | ICD-10-CM | POA: Insufficient documentation

## 2015-08-11 DIAGNOSIS — Z79899 Other long term (current) drug therapy: Secondary | ICD-10-CM | POA: Insufficient documentation

## 2015-08-11 DIAGNOSIS — E119 Type 2 diabetes mellitus without complications: Secondary | ICD-10-CM | POA: Diagnosis not present

## 2015-08-11 DIAGNOSIS — J189 Pneumonia, unspecified organism: Secondary | ICD-10-CM | POA: Diagnosis not present

## 2015-08-11 DIAGNOSIS — R042 Hemoptysis: Secondary | ICD-10-CM | POA: Diagnosis present

## 2015-08-11 MED ORDER — AZITHROMYCIN 250 MG PO TABS: 250.0000 mg | ORAL_TABLET | Freq: Every day | ORAL | Status: DC

## 2015-08-11 MED ORDER — ALBUTEROL SULFATE HFA 108 (90 BASE) MCG/ACT IN AERS
2.0000 | INHALATION_SPRAY | Freq: Once | RESPIRATORY_TRACT | Status: DC
  Filled 2015-08-11: qty 6.7

## 2015-08-11 NOTE — ED Notes (Signed)
MD at bedside. 

## 2015-08-11 NOTE — Discharge Instructions (Signed)

## 2015-08-11 NOTE — ED Provider Notes (Signed)
CSN: [id number]     Arrival date & time 08/11/15  0830 History   First MD Initiated Contact with Patient 08/11/15 0901     Chief Complaint  Patient presents with  . Hemoptysis     (Consider location/radiation/quality/duration/timing/severity/associated sxs/prior Treatment) Patient is a 54 y.o. female presenting with cough. The history is provided by the patient.  Cough Cough characteristics:  Non-productive and productive Sputum characteristics:  Bloody, yellow and green Severity:  Moderate Onset quality:  Gradual Duration:  4 weeks Timing:  Constant Progression:  Unchanged Chronicity:  New Smoker: occasional.   Relieved by:  Nothing Worsened by:  Nothing tried Ineffective treatments:  None tried Associated symptoms: rhinorrhea   Associated symptoms: no chest pain, no fever, no shortness of breath and no wheezing   Risk factors: no recent travel     Past Medical History  Diagnosis Date  . Diabetes mellitus without complication ([place])   . Hypertension    Past Surgical History  Procedure Laterality Date  . Abdominal hysterectomy     No family history on file. Social History  Substance Use Topics  . Smoking status: Never Smoker   . Smokeless tobacco: Never Used  . Alcohol Use: No   OB History    No data available     Review of Systems  Constitutional: Negative for fever.  HENT: Positive for rhinorrhea.   Respiratory: Positive for cough. Negative for shortness of breath and wheezing.   Cardiovascular: Negative for chest pain.  All other systems reviewed and are negative.     Allergies  Review of patient's allergies indicates no known allergies.  Home Medications   Prior to Admission medications   Medication Sig Start Date End Date Taking? Authorizing Provider  Blood Glucose Monitoring Suppl (BLOOD GLUCOSE METER) kit Use as instructed 05/25/12  Yes Sarah Alleen Borne, PA-C  dextromethorphan-guaiFENesin (MUCINEX DM) 30-600 MG 12hr tablet Take 1 tablet by mouth 2  (two) times daily.   Yes Historical Provider, MD  [employer] LANCETS MISC To check blood sugar tid/ 250.00 dx code uncontrolled diabetes 08/07/13  Yes Robyn Haber, MD  glipiZIDE (GLUCOTROL) 5 MG tablet Take 1 tablet (5 mg total) by mouth 2 (two) times daily before a meal. NO MORE REFILLS WITHOUT OFFICE VISIT - 2ND NOTICE 11/15/14  Yes Dorian Heckle English, PA  Homeopathic Products [facility] ALLERGY RELIEF NA) Place 1 spray into the nose 2 (two) times daily as needed (allergies).   Yes Historical Provider, MD  metFORMIN (GLUCOPHAGE-XR) 750 MG 24 hr tablet Take 1 tablet (750 mg total) by mouth daily. NO MORE REFILLS WITHOUT OFFICE VISIT - 2ND NOTICE 11/15/14  Yes Colletta [place] D English, PA  ondansetron (ZOFRAN ODT) 4 MG disintegrating tablet Take 1 tablet (4 mg total) by mouth every 8 (eight) hours as needed for nausea. 11/01/14  Yes Larene Pickett, PA-C  valsartan-hydrochlorothiazide (DIOVAN-HCT) 320-12.5 MG per tablet Take 1 tablet by mouth daily. NO MORE REFILLS WITHOUT OFFICE VISIT - 2ND NOTICE 11/15/14  Yes Dorian Heckle English, PA  amLODipine (NORVASC) 5 MG tablet Take 5 mg by mouth daily. 10/13/14   Historical Provider, MD  azithromycin (ZITHROMAX) 250 MG tablet Take 1 tablet (250 mg total) by mouth daily. Take first 2 tablets together, then 1 every day until finished. 08/11/15   Leo Grosser, MD   BP 113/82 mmHg  Pulse 62  Temp(Src) 98.4 F (36.9 C) (Oral)  Resp 17  Ht _0  (1.651 m)  Wt 191 lb (86.637 kg)  BMI 31.78 kg/m2  SpO2 98% Physical Exam  Constitutional: She is oriented to person, place, and time. She appears well-developed and well-nourished. No distress.  HENT:  Head: Normocephalic.  Eyes: Conjunctivae are normal.  Neck: Neck supple. No tracheal deviation present.  Cardiovascular: Normal rate, regular rhythm and normal heart sounds.   Pulmonary/Chest: Effort normal and breath sounds normal. No respiratory distress. She has no wheezes. She has no rales. She exhibits no tenderness.   Abdominal: Soft. She exhibits no distension.  Neurological: She is alert and oriented to person, place, and time.  Skin: Skin is warm and dry.  Psychiatric: She has a normal mood and affect.  Vitals reviewed.   ED Course  Procedures (including critical care time) Labs Review Labs Reviewed - No data to display  Imaging Review Dg Chest 2 View  08/11/2015  CLINICAL DATA:  Hemoptysis EXAM: CHEST - 2 VIEW COMPARISON:  10/31/2014 FINDINGS: Cardiac shadow is within normal limits. The lungs are well aerated bilaterally. Left lingular infiltrate is seen. No acute bony abnormality is noted. IMPRESSION: Lingular infiltrate on the left. Followup PA and lateral chest X-ray is recommended in 3-4 weeks following trial of antibiotic therapy to ensure resolution and exclude underlying malignancy. Electronically Signed   By: Inez Catalina M.D.   On: 08/11/2015 10:36   I have personally reviewed and evaluated these images and lab results as part of my medical decision-making.   EKG Interpretation None      MDM   Final diagnoses:  Atypical pneumonia   54 year old female presents with 1 month of ongoing cough and recent ongoing congestion. No shortness of breath, no chest pain, no leg swelling, no travel or hypercoagulable state. Except for hemoptysis no other typical symptoms of PE and not tachycardic. With prolonged cough and new onset hemoptysis and increased sputum production chest x-ray was ordered.  Lingular consolidation concerning for possible atypical pneumonia. No TB risk or exposure. No GI symptoms to suggest legionella. Will treat empirically with azithromycin and recommend follow-up radiography and primary care office to ensure resolution and rule out underlying mass. Plan to follow up with PCP as needed and return precautions discussed for worsening or new concerning symptoms.    Leo Grosser, MD 08/12/15 [phone]

## 2015-08-11 NOTE — ED Notes (Addendum)
Pt presents via POV c/o coughing up blood x 2 weeks ago, reports a "nasty" cough x 1 month that is passing around at work.  Has been taking mucinex for congestion.  Pt a x 4, NAD.  Denies blood thinners.

## 2015-11-10 ENCOUNTER — Ambulatory Visit (INDEPENDENT_AMBULATORY_CARE_PROVIDER_SITE_OTHER): Payer: Managed Care, Other (non HMO) | Admitting: Neurology

## 2015-11-10 ENCOUNTER — Other Ambulatory Visit (INDEPENDENT_AMBULATORY_CARE_PROVIDER_SITE_OTHER): Payer: Managed Care, Other (non HMO)

## 2015-11-10 ENCOUNTER — Encounter: Payer: Self-pay | Admitting: Neurology

## 2015-11-10 VITALS — BP 160/84 | HR 76 | Ht 65.0 in | Wt 205.2 lb

## 2015-11-10 DIAGNOSIS — R413 Other amnesia: Secondary | ICD-10-CM

## 2015-11-10 DIAGNOSIS — R278 Other lack of coordination: Secondary | ICD-10-CM

## 2015-11-10 DIAGNOSIS — F919 Conduct disorder, unspecified: Secondary | ICD-10-CM

## 2015-11-10 DIAGNOSIS — R4689 Other symptoms and signs involving appearance and behavior: Principal | ICD-10-CM

## 2015-11-10 DIAGNOSIS — R4189 Other symptoms and signs involving cognitive functions and awareness: Secondary | ICD-10-CM | POA: Diagnosis not present

## 2015-11-10 LAB — TSH: TSH: 1.45 u[IU]/mL (ref 0.35–4.50)

## 2015-11-10 NOTE — Progress Notes (Signed)
[facility] Clinic Note - Initial Visit   Date: 11/10/2015  Holly Spencer MRN: [id number] DOB: 12-06-1960    Dear Eloise Levels, NP:  Thank you for your kind referral of Holly Spencer for consultation of memory changes. Although her history is well known to you, please allow [place] to reiterate it for the purpose of our medical record. The patient was accompanied to the clinic by self.   History of Present Illness: Holly Spencer is a 55 y.o. right-handed African American female with diabetes mellitus and hypertension presenting for evaluation of memory and behavior changes.    Since early 2016, her family has noticed that patient repeats herself and forgets things. She tends to forget details of a conversation, often misplaces items.  She feels that her family his always watching her and it makes her nervous.  She is working full-time as a Designer, television/film set and endorses making errors.  Her husband began manages finances about 6 months ago.  She takes own medication without prompting and admits to skipping them about once per week.  She cannot multitask effectively.  She does not think she has word-finding difficulty.  She is not getting get turned around in her own home, have problems recognizing family members, and does her ADLs indepdently such as bathing and dressing.  Her husband has been doing more cooking lately.  She denies any auditory or visual hallucinations.    She had on episode at the end of February whee she drove to work, but found herself in a parking lot of a gas station.  She was approached by the [employer] to see if she was okay and was told that she was sitting there for 3 hours with the engine on. She recalls leaving for work and when the attendant knocked on her window, but does not recall any other details.  She called her husband who took her home.  She is no longer driving and takes an [employer] Popper to work daily or her daughter will drive  her.  In the fall, her husband and 17-year daughter noticed that there were a lot of fruit flies in the kitchen and there was a foul odor.  They attempted to find the cause and ultimately, found that there was a pound of ground beef in the cabinets.  She endorses other mishaps like these, but does not elaborate.  Mood is low with everything going on because she feels paranoid.  She has insomnia and does report adequate sleep.  She does not exercise and only hobby is reading magazines.    Out-side paper records, electronic medical record, and images have been reviewed where available and summarized as:  Labs 10/19/2015:  HbA1c 8.0, Na 140, K 4.0, Chl 103, AG 10, Ca 10.2, AST 14, ALT 16, vitamin B12 210  Past Medical History  Diagnosis Date  . Diabetes mellitus without complication ([place])   . Hypertension     Past Surgical History  Procedure Laterality Date  . Abdominal hysterectomy       Medications:  Outpatient Encounter Prescriptions as of 11/10/2015  Medication Sig Note  . amLODipine (NORVASC) 5 MG tablet Take 5 mg by mouth daily. 10/31/2014: ....  . Blood Glucose Monitoring Suppl (BLOOD GLUCOSE METER) kit Use as instructed   . FINGERSTIX LANCETS MISC To check blood sugar tid/ 250.00 dx code uncontrolled diabetes   . glipiZIDE (GLUCOTROL) 5 MG tablet Take 1 tablet (5 mg total) by mouth 2 (two) times daily before a  meal. NO MORE REFILLS WITHOUT OFFICE VISIT - 2ND NOTICE   . Homeopathic Products (ZICAM ALLERGY RELIEF NA) Place 1 spray into the nose 2 (two) times daily as needed (allergies).   . metFORMIN (GLUCOPHAGE-XR) 750 MG 24 hr tablet Take 1 tablet (750 mg total) by mouth daily. NO MORE REFILLS WITHOUT OFFICE VISIT - 2ND NOTICE   . ondansetron (ZOFRAN ODT) 4 MG disintegrating tablet Take 1 tablet (4 mg total) by mouth every 8 (eight) hours as needed for nausea.   . valsartan-hydrochlorothiazide (DIOVAN-HCT) 320-12.5 MG per tablet Take 1 tablet by mouth daily. NO MORE REFILLS WITHOUT  OFFICE VISIT - 2ND NOTICE   . [DISCONTINUED] azithromycin (ZITHROMAX) 250 MG tablet Take 1 tablet (250 mg total) by mouth daily. Take first 2 tablets together, then 1 every day until finished.   . [DISCONTINUED] dextromethorphan-guaiFENesin (MUCINEX DM) 30-600 MG 12hr tablet Take 1 tablet by mouth 2 (two) times daily.    No facility-administered encounter medications on file as of 11/10/2015.     Allergies: No Known Allergies  Family History: Family History  Problem Relation Age of Onset  . Diabetes type I Father     Deceased  . Asthma Mother     Living, 48  . Diabetes Mellitus II Mother   . Diabetes Mellitus II Sister   . Hypertension Sister   . Healthy Daughter   . Healthy Son     Social History: Social History  Substance Use Topics  . Smoking status: Never Smoker   . Smokeless tobacco: Never Used  . Alcohol Use: No   Social History   Social History Narrative   Lives with husband and one of her children in a 2 story home.  Has 3 children.     Works for [employer].     Education: associates degree.    Review of Systems:  CONSTITUTIONAL: No fevers, chills, night sweats, or weight loss.   EYES: No visual changes or eye pain ENT: No hearing changes.  No history of nose bleeds.   RESPIRATORY: No cough, wheezing and shortness of breath.   CARDIOVASCULAR: Negative for chest pain, and palpitations.   GI: Negative for abdominal discomfort, blood in stools or black stools.  No recent change in bowel habits.   GU:  No history of incontinence.   MUSCLOSKELETAL: No history of joint pain or swelling.  No myalgias.   SKIN: Negative for lesions, rash, and itching.   HEMATOLOGY/ONCOLOGY: Negative for prolonged bleeding, bruising easily, and swollen nodes.  No history of cancer.   ENDOCRINE: Negative for cold or heat intolerance, polydipsia or goiter.   PSYCH:  +depression or anxiety symptoms.   NEURO: As Above.   Vital Signs:  BP 160/84 mmHg  Pulse 76   Ht _0  (1.651 m)  Wt 205 lb 3 oz (93.072 kg)  BMI 34.14 kg/m2  SpO2 98%  Montreal Cognitive Assessment  11/10/2015 11/10/2015  Visuospatial/ Executive (0/5) 4 4  Naming (0/3) 3 3  Attention: Read list of digits (0/2) 0 2  Attention: Read list of letters (0/1) 1 1  Attention: Serial 7 subtraction starting at 100 (0/3) 1 -  Language: Repeat phrase (0/2) 0 -  Language : Fluency (0/1) 1 -  Abstraction (0/2) 1 -  Delayed Recall (0/5) 0 -  Orientation (0/6) 6 -  Total 17 -    General Medical Exam:   General:  Slightly dishelved appearing, comfortable.   Eyes/ENT: see cranial nerve examination.   Neck: No masses  appreciated.  Full range of motion without tenderness.  No carotid bruits. Respiratory:  Clear to auscultation, good air entry bilaterally.   Cardiac:  Regular rate and rhythm, no murmur.   Extremities:  No deformities, edema, or skin discoloration.  Skin:  No rashes or lesions.  Neurological Exam: MENTAL STATUS including orientation to time, place, person, recent and remote memory, attention span and concentration, language, and fund of knowledge is fairly intact.  Speech is not dysarthric.  Luria test is positive bilaterally.  Graphesthesia intact.  Insight and judgement seem intact.  Montreal Cognitive Assessment  11/10/2015  Visuospatial/ Executive (0/5) 4  Naming (0/3) 3  Attention: Read list of digits (0/2) 0  Attention: Read list of letters (0/1) 1  Attention: Serial 7 subtraction starting at 100 (0/3) 1  Language: Repeat phrase (0/2) 0  Language : Fluency (0/1) 1  Abstraction (0/2) 1  Delayed Recall (0/5) 0  Orientation (0/6) 6  Total 17    CRANIAL NERVES: II:  No visual field defects.  Unremarkable fundi.   III-IV-VI: Pupils equal round and reactive to light.  Normal conjugate, extra-ocular eye movements in all directions of gaze.  No nystagmus.  No ptosis.   V:  Normal facial sensation.  Jaw jerk is absent.   VII:  Normal facial symmetry and movements.  No  pathologic facial reflexes.  VIII:  Normal hearing and vestibular function.   IX-X:  Normal palatal movement.   XI:  Normal shoulder shrug and head rotation.   XII:  Normal tongue strength and range of motion, no deviation or fasciculation.  MOTOR:  No atrophy, fasciculations or abnormal movements.  No pronator drift.  Tone is normal.    Right Upper Extremity:    Left Upper Extremity:    Deltoid  5/5   Deltoid  5/5   Biceps  5/5   Biceps  5/5   Triceps  5/5   Triceps  5/5   Wrist extensors  5/5   Wrist extensors  5/5   Wrist flexors  5/5   Wrist flexors  5/5   Finger extensors  5/5   Finger extensors  5/5   Finger flexors  5/5   Finger flexors  5/5   Dorsal interossei  5/5   Dorsal interossei  5/5   Abductor pollicis  5/5   Abductor pollicis  5/5   Tone (Ashworth scale)  0  Tone (Ashworth scale)  0   Right Lower Extremity:    Left Lower Extremity:    Hip flexors  5/5   Hip flexors  5/5   Hip extensors  5/5   Hip extensors  5/5   Knee flexors  5/5   Knee flexors  5/5   Knee extensors  5/5   Knee extensors  5/5   Dorsiflexors  5/5   Dorsiflexors  5/5   Plantarflexors  5/5   Plantarflexors  5/5   Toe extensors  5/5   Toe extensors  5/5   Toe flexors  5/5   Toe flexors  5/5   Tone (Ashworth scale)  0  Tone (Ashworth scale)  0   MSRs:  Right  Left brachioradialis 2+  brachioradialis 2+  biceps 2+  biceps 2+  triceps 2+  triceps 2+  patellar 2+  patellar 2+  ankle jerk 1+  ankle jerk 1+  Hoffman no  Hoffman no  plantar response down  plantar response down   SENSORY:  Normal and symmetric perception of light touch, pinprick, vibration, and proprioception.  Romberg's sign positive.   COORDINATION/GAIT: Normal finger-to- nose-finger and heel-to-shin.  Intact rapid alternating movements bilaterally.  Able to rise from a chair without using arms.  Gait narrow based and stable. Unsteady with tandem and stressed gait.     IMPRESSION: Mrs. Glennie is a 55 year-old female referred for evaluation of cognitive and behavior changes.  Her cognitive testing shows moderate dysfunction especially involving attention and recall.  Neurological exam shows mild sensory ataxia.  She is very bothered by her family being concerned about her behavior and endorses that her symptoms is making social situations at home more difficult.  I am concerned about the prolonged lapses in her memory, which can be seen in transient global amnesia, but in this condition, amnesia is isolated events without ongoing cognitive dysfunction, which leads me to think she may have mood disorder with depression/anxiety or early dementia syndrome.  Of note, her vitamin B12 levels were low-normal when checked recently.    PLAN/RECOMMENDATIONS:  1.  Check vitamin B1, MMA, TSH, copper 2.  MRI brain wo contrast 3.  Formal neuropsychiatric testing 4.  No driving 5.  Home and work safety issues discussed including delegating responsibilities to others, especially if she is making errors at work   Return to clinic after the above testing   The duration of this appointment visit was 60 minutes of face-to-face time with the patient.  Greater than 50% of this time was spent in counseling, explanation of diagnosis, planning of further management, and coordination of care.   Thank you for allowing me to participate in patient's care.  If I can answer any additional questions, I would be pleased to do so.    Sincerely,    Donika K. Posey Pronto, DO

## 2015-11-10 NOTE — Patient Instructions (Addendum)
1.  Check blood work 2.  MRI brain without contrast 3.  Neuropsychiatric testing 4.  Please delegate some of your work responsibilities to other in your office as to avoid errors 5.  Do not drive until further instruction  Return to clinic in 3 months

## 2015-11-10 NOTE — Progress Notes (Signed)
Note routed

## 2015-11-13 LAB — METHYLMALONIC ACID, SERUM: Methylmalonic Acid, Quant: 95 nmol/L (ref 87–318)

## 2015-11-15 LAB — VITAMIN B1: Vitamin B1 (Thiamine): 9 nmol/L (ref 8–30)

## 2015-11-16 LAB — COPPER, SERUM: Copper: 117 ug/dL (ref 72–166)

## 2015-11-17 ENCOUNTER — Encounter: Payer: Self-pay | Admitting: *Deleted

## 2015-11-18 ENCOUNTER — Other Ambulatory Visit: Payer: Managed Care, Other (non HMO)

## 2016-02-12 ENCOUNTER — Ambulatory Visit: Payer: Managed Care, Other (non HMO) | Admitting: Neurology

## 2016-03-04 ENCOUNTER — Emergency Department (HOSPITAL_COMMUNITY)
Admission: EM | Admit: 2016-03-04 | Discharge: 2016-03-04 | Disposition: A | Discharge status: Home / Self Care | Payer: Managed Care, Other (non HMO) | Attending: Emergency Medicine | Admitting: Emergency Medicine

## 2016-03-04 ENCOUNTER — Encounter (HOSPITAL_COMMUNITY): Payer: Self-pay | Admitting: Emergency Medicine

## 2016-03-04 ENCOUNTER — Emergency Department (HOSPITAL_COMMUNITY): Payer: Managed Care, Other (non HMO)

## 2016-03-04 ENCOUNTER — Ambulatory Visit (INDEPENDENT_AMBULATORY_CARE_PROVIDER_SITE_OTHER): Payer: Managed Care, Other (non HMO) | Admitting: Family Medicine

## 2016-03-04 VITALS — BP 191/85 | HR 72 | Temp 98.1°F | Resp 16 | Ht 65.0 in | Wt 196.0 lb

## 2016-03-04 DIAGNOSIS — R112 Nausea with vomiting, unspecified: Secondary | ICD-10-CM

## 2016-03-04 DIAGNOSIS — I10 Essential (primary) hypertension: Secondary | ICD-10-CM | POA: Diagnosis not present

## 2016-03-04 DIAGNOSIS — R1084 Generalized abdominal pain: Secondary | ICD-10-CM | POA: Diagnosis not present

## 2016-03-04 DIAGNOSIS — Z8639 Personal history of other endocrine, nutritional and metabolic disease: Secondary | ICD-10-CM

## 2016-03-04 DIAGNOSIS — R739 Hyperglycemia, unspecified: Secondary | ICD-10-CM

## 2016-03-04 DIAGNOSIS — E119 Type 2 diabetes mellitus without complications: Secondary | ICD-10-CM | POA: Insufficient documentation

## 2016-03-04 DIAGNOSIS — R1013 Epigastric pain: Secondary | ICD-10-CM | POA: Insufficient documentation

## 2016-03-04 DIAGNOSIS — D72829 Elevated white blood cell count, unspecified: Secondary | ICD-10-CM

## 2016-03-04 DIAGNOSIS — R079 Chest pain, unspecified: Secondary | ICD-10-CM

## 2016-03-04 DIAGNOSIS — Z7984 Long term (current) use of oral hypoglycemic drugs: Secondary | ICD-10-CM | POA: Insufficient documentation

## 2016-03-04 LAB — POCT CBC
GRANULOCYTE PERCENT: 84.5 % — AB (ref 37–80)
HEMATOCRIT: 43.7 % (ref 37.7–47.9)
Hemoglobin: 15.6 g/dL (ref 12.2–16.2)
Lymph, poc: 2.1 (ref 0.6–3.4)
MCH: 31.7 pg — AB (ref 27–31.2)
MCHC: 35.6 g/dL — AB (ref 31.8–35.4)
MCV: 89 fL (ref 80–97)
MID (CBC): 0.9 (ref 0–0.9)
MPV: 7.8 fL (ref 0–99.8)
POC GRANULOCYTE: 16.6 — AB (ref 2–6.9)
POC LYMPH PERCENT: 10.8 %L (ref 10–50)
POC MID %: 4.7 %M (ref 0–12)
Platelet Count, POC: 326 10*3/uL (ref 142–424)
RBC: 4.91 M/uL (ref 4.04–5.48)
RDW, POC: 13.9 %
WBC: 19.7 10*3/uL — AB (ref 4.6–10.2)

## 2016-03-04 LAB — COMPREHENSIVE METABOLIC PANEL
ALBUMIN: 4.6 g/dL (ref 3.5–5.0)
ALK PHOS: 115 U/L (ref 38–126)
ALT: 20 U/L (ref 14–54)
ANION GAP: 15 (ref 5–15)
AST: 24 U/L (ref 15–41)
BUN: 19 mg/dL (ref 6–20)
CALCIUM: 10.1 mg/dL (ref 8.9–10.3)
CHLORIDE: 98 mmol/L — AB (ref 101–111)
CO2: 22 mmol/L (ref 22–32)
Creatinine, Ser: 1.28 mg/dL — ABNORMAL HIGH (ref 0.44–1.00)
GFR calc non Af Amer: 47 mL/min — ABNORMAL LOW (ref >60)
GFR, EST AFRICAN AMERICAN: 54 mL/min — AB (ref >60)
GLUCOSE: 233 mg/dL — AB (ref 65–99)
POTASSIUM: 3.7 mmol/L (ref 3.5–5.1)
SODIUM: 135 mmol/L (ref 135–145)
Total Bilirubin: 0.7 mg/dL (ref 0.3–1.2)
Total Protein: 8.5 g/dL — ABNORMAL HIGH (ref 6.5–8.1)

## 2016-03-04 LAB — POCT URINALYSIS DIP (MANUAL ENTRY)
Glucose, UA: 500 — AB
Leukocytes, UA: NEGATIVE
Nitrite, UA: NEGATIVE
Protein Ur, POC: >=300 — AB
Spec Grav, UA: >=1.03
Urobilinogen, UA: 0.2
pH, UA: 5.5

## 2016-03-04 LAB — URINALYSIS, ROUTINE W REFLEX MICROSCOPIC
GLUCOSE, UA: 250 mg/dL — AB
KETONES UR: 15 mg/dL — AB
NITRITE: NEGATIVE
PROTEIN: 100 mg/dL — AB
Specific Gravity, Urine: 1.028 (ref 1.005–1.030)
pH: 5 (ref 5.0–8.0)

## 2016-03-04 LAB — URINE MICROSCOPIC-ADD ON

## 2016-03-04 LAB — POC MICROSCOPIC URINALYSIS (UMFC): MUCUS RE: ABSENT

## 2016-03-04 LAB — CBG MONITORING, ED
GLUCOSE-CAPILLARY: 260 mg/dL — AB (ref 65–99)
GLUCOSE-CAPILLARY: 157 mg/dL — AB (ref 65–99)

## 2016-03-04 LAB — LIPASE, BLOOD: LIPASE: 22 U/L (ref 11–51)

## 2016-03-04 LAB — GLUCOSE, POCT (MANUAL RESULT ENTRY): POC Glucose: 234 mg/dl — AB (ref 70–99)

## 2016-03-04 MED ORDER — ONDANSETRON 4 MG PO TBDP
4.0000 mg | ORAL_TABLET | Freq: Once | ORAL | Status: AC | PRN
  Administered 2016-03-04: 4 mg via ORAL

## 2016-03-04 MED ORDER — ONDANSETRON 4 MG PO TBDP
4.0000 mg | ORAL_TABLET | Freq: Once | ORAL | Status: AC
  Administered 2016-03-04: 4 mg via ORAL

## 2016-03-04 MED ORDER — ONDANSETRON HCL 4 MG/2ML IJ SOLN
4.0000 mg | Freq: Once | INTRAMUSCULAR | Status: AC
  Administered 2016-03-04: 4 mg via INTRAVENOUS
  Filled 2016-03-04: qty 2

## 2016-03-04 MED ORDER — SODIUM CHLORIDE 0.9 % IV BOLUS (SEPSIS)
1000.0000 mL | Freq: Once | INTRAVENOUS | Status: AC
  Administered 2016-03-04: 1000 mL via INTRAVENOUS

## 2016-03-04 MED ORDER — ONDANSETRON 4 MG PO TBDP
ORAL_TABLET | ORAL | Status: AC
  Filled 2016-03-04: qty 1

## 2016-03-04 MED ORDER — MORPHINE SULFATE (PF) 4 MG/ML IV SOLN: 4.0000 mg | Freq: Once | INTRAVENOUS | Status: DC

## 2016-03-04 MED ORDER — IOPAMIDOL (ISOVUE-300) INJECTION 61%
INTRAVENOUS | Status: AC
  Administered 2016-03-04: 100 mL
  Filled 2016-03-04: qty 100

## 2016-03-04 MED ORDER — ONDANSETRON 4 MG PO TBDP: 4.0000 mg | ORAL_TABLET | Freq: Three times a day (TID) | ORAL | Status: DC | PRN

## 2016-03-04 NOTE — ED Notes (Signed)
Pt reports increasing cbgs since last Thursday with nausea, vomiting, diarrhea since the same. Pt reports that she went to urgent care this morning and doctor there told her that he wbc count was elevated and was concerned for DKA. Pt sts nausea improved with zofran but is coming back now.

## 2016-03-04 NOTE — ED Provider Notes (Signed)
CSN: [id number]     Arrival date & time 03/04/16  1119 History   First MD Initiated Contact with Patient 03/04/16 1617     Chief Complaint  Patient presents with  . Hyperglycemia  . Nausea  . Emesis  . Diarrhea     (Consider location/radiation/quality/duration/timing/severity/associated sxs/prior Treatment) HPI   The patient is a 55 year old female, she is a known diabetic, she reports that she was at the beach last week and did not take her medication like she should've, she also ate out frequently and paid no attention to her diabetic status. Upon return the patient became nauseated, has had recurrent episodes of vomiting and has had progressive abdominal discomfort in the epigastric region. The patient went to the urgent care this morning, she had some blood work done and there was concern for an elevation in white blood cell count and a possible appendicitis or DKA given the location of the patient's pain. She was sent to the hospital for further evaluation. Her abdominal pain is persistent, gradually worsening, associated with vomiting  Past Medical History  Diagnosis Date  . Diabetes mellitus without complication ([place])   . Hypertension    Past Surgical History  Procedure Laterality Date  . Abdominal hysterectomy     Family History  Problem Relation Age of Onset  . Diabetes type I Father     Deceased  . Asthma Mother     Living, 73  . Diabetes Mellitus II Mother   . Diabetes Mellitus II Sister   . Hypertension Sister   . Healthy Daughter   . Healthy Son    Social History  Substance Use Topics  . Smoking status: Never Smoker   . Smokeless tobacco: Never Used  . Alcohol Use: No   OB History    No data available     Review of Systems  All other systems reviewed and are negative.     Allergies  Review of patient's allergies indicates no known allergies.  Home Medications   Prior to Admission medications   Medication Sig Start Date End Date Taking?  Authorizing Provider  amLODipine (NORVASC) 5 MG tablet Take 5 mg by mouth daily. 10/13/14   Historical Provider, MD  Blood Glucose Monitoring Suppl (BLOOD GLUCOSE METER) kit Use as instructed 05/25/12   Mancel Bale, PA-C  FINGERSTIX LANCETS MISC To check blood sugar tid/ 250.00 dx code uncontrolled diabetes 08/07/13   Robyn Haber, MD  glipiZIDE (GLUCOTROL) 5 MG tablet Take 1 tablet (5 mg total) by mouth 2 (two) times daily before a meal. NO MORE REFILLS WITHOUT OFFICE VISIT - 2ND NOTICE 11/15/14   Dorian Heckle English, PA  Homeopathic Products [facility] ALLERGY RELIEF NA) Place 1 spray into the nose 2 (two) times daily as needed (allergies).    Historical Provider, MD  metFORMIN (GLUCOPHAGE-XR) 750 MG 24 hr tablet Take 1 tablet (750 mg total) by mouth daily. NO MORE REFILLS WITHOUT OFFICE VISIT - 2ND NOTICE 11/15/14   Dorian Heckle English, PA  ondansetron (ZOFRAN ODT) 4 MG disintegrating tablet Take 1 tablet (4 mg total) by mouth every 8 (eight) hours as needed for nausea. 03/04/16   Noemi Chapel, MD  valsartan-hydrochlorothiazide (DIOVAN-HCT) 320-12.5 MG per tablet Take 1 tablet by mouth daily. NO MORE REFILLS WITHOUT OFFICE VISIT - 2ND NOTICE 11/15/14   Dorian Heckle English, PA   BP 137/71 mmHg  Pulse 77  Temp(Src) 98.5 F (36.9 C) (Oral)  Resp 18  SpO2 95% Physical Exam  Constitutional: She appears  well-developed and well-nourished. No distress.  HENT:  Head: Normocephalic and atraumatic.  Mouth/Throat: Oropharynx is clear and moist. No oropharyngeal exudate.  Eyes: Conjunctivae and EOM are normal. Pupils are equal, round, and reactive to light. Right eye exhibits no discharge. Left eye exhibits no discharge. No scleral icterus.  Neck: Normal range of motion. Neck supple. No JVD present. No thyromegaly present.  Cardiovascular: Normal rate, regular rhythm, normal heart sounds and intact distal pulses.  Exam reveals no gallop and no friction rub.   No murmur heard. Pulmonary/Chest: Effort normal  and breath sounds normal. No respiratory distress. She has no wheezes. She has no rales.  Abdominal: Soft. Bowel sounds are normal. She exhibits no distension and no mass. There is tenderness ( Tenderness in the epigastrium with mild distention and tympanitic sounds to percussion, no other abdominal tenderness).  Musculoskeletal: Normal range of motion. She exhibits no edema or tenderness.  Lymphadenopathy:    She has no cervical adenopathy.  Neurological: She is alert. Coordination normal.  Skin: Skin is warm and dry. No rash noted. No erythema.  Psychiatric: She has a normal mood and affect. Her behavior is normal.  Nursing note and vitals reviewed.   ED Course  Procedures (including critical care time) Labs Review Labs Reviewed  URINALYSIS, ROUTINE W REFLEX MICROSCOPIC (NOT AT [facility]) - Abnormal; Notable for the following:    Color, Urine AMBER (*)    APPearance TURBID (*)    Glucose, UA 250 (*)    Hgb urine dipstick MODERATE (*)    Bilirubin Urine SMALL (*)    Ketones, ur 15 (*)    Protein, ur 100 (*)    Leukocytes, UA SMALL (*)    All other components within normal limits  COMPREHENSIVE METABOLIC PANEL - Abnormal; Notable for the following:    Chloride 98 (*)    Glucose, Bld 233 (*)    Creatinine, Ser 1.28 (*)    Total Protein 8.5 (*)    GFR calc non Af Amer 47 (*)    GFR calc Af Amer 54 (*)    All other components within normal limits  URINE MICROSCOPIC-ADD ON - Abnormal; Notable for the following:    Squamous Epithelial / LPF 6-30 (*)    Bacteria, UA MANY (*)    Casts HYALINE CASTS (*)    All other components within normal limits  CBG MONITORING, ED - Abnormal; Notable for the following:    Glucose-Capillary 260 (*)    All other components within normal limits  CBG MONITORING, ED - Abnormal; Notable for the following:    Glucose-Capillary 157 (*)    All other components within normal limits  URINE CULTURE  LIPASE, BLOOD   Imaging Review Ct Abdomen Pelvis W  Contrast  03/04/2016  CLINICAL DATA:  Hyperglycemia, nausea, vomiting and diarrhea with acute abdominal pain. Elevated white count. EXAM: CT ABDOMEN AND PELVIS WITHOUT AND WITH CONTRAST TECHNIQUE: Multidetector CT imaging of the abdomen and pelvis was performed following the standard protocol before and following the bolus administration of intravenous contrast. CONTRAST:  80 ISOVUE-300 IOPAMIDOL (ISOVUE-300) INJECTION 61% COMPARISON:  None. FINDINGS: Lower chest: Minor respiratory motion artifact through the lower chest. Minor lingula and medial right middle lobe atelectasis versus scarring. Dependent basilar atelectasis well. No acute lower chest process. Normal heart size. No pericardial or pleural effusion. Hepatobiliary: Diffuse hypoattenuation of the liver parenchyma compatible with hepatic steatosis. No focal hepatic abnormality or biliary dilatation. Patent hepatic and portal veins. Gallbladder and biliary system within normal  limits. Pancreas: No mass, inflammatory changes, or other significant abnormality. Spleen: Spleen is normal in size. Medial splenic well-circumscribed 18 mm hypodense lesion, suspect splenic cyst. No other splenic abnormality. Adrenals/Urinary Tract: No masses identified. No evidence of hydronephrosis. Tiny 10 mm left lower pole renal cyst. Stomach/Bowel: Negative for bowel obstruction, significant dilatation, ileus, or free air. Normal appendix demonstrated extending into the right hemipelvis. No fluid collection or abscess. Vascular/Lymphatic: No adenopathy. Aortoiliac atherosclerosis noted without occlusive process or aneurysm. No acute retroperitoneal abnormality. Reproductive: Remote hysterectomy. No pelvic fluid collection or abscess. No adnexal abnormality. Other: No inguinal abnormality or hernia. Intact abdominal wall. Obese body habitus evident. Musculoskeletal: Degenerative changes of the lower lumbar spine, lower lumbar facet joints, and SI joints. IMPRESSION: No acute  intra-abdominal or pelvic finding. Hepatic steatosis Aortic atherosclerosis Remote hysterectomy Incidental 2 cm splenic hypodense cyst Electronically Signed   By: Jerilynn Mages.  Shick M.D.   On: 03/04/2016 19:18   I have personally reviewed and evaluated these images and lab results as part of my medical decision-making.  MDM   Final diagnoses:  Non-intractable vomiting with nausea, vomiting of unspecified type    The patient does not appear ill but she does have focal tenderness in her abdomen with an elevated white blood cell count from the prehospital setting, her urinalysis shows many bacteria with a few white blood cells, negative nitrites. Glucose is 230 but bicarbonate is normal and anion gap is 15. We'll start with IV fluids, symptomatic control of nausea and pain medications and a CT scan of the abdomen to rule out other sources of the patient's abdominal pain though I suspect this is gastroparesis. She has had some rise in her creatinine and thus IV fluids will be given pre-contrast  Pt has normal CT, labs given to pt verbally - expressed her understanding - has improved, tolerating PO, home with zofran - likely some gastroparesis - has no urinary sx, will culture  Meds given in ED:  Medications  ondansetron (ZOFRAN) injection 4 mg (0 mg Intravenous Hold 03/04/16 1656)  morphine 4 MG/ML injection 4 mg (4 mg Intravenous Not Given 03/04/16 1655)  ondansetron (ZOFRAN-ODT) disintegrating tablet 4 mg (4 mg Oral Given 03/04/16 1213)  sodium chloride 0.9 % bolus 1,000 mL (0 mLs Intravenous Stopped 03/04/16 1932)  iopamidol (ISOVUE-300) 61 % injection (100 mLs  Contrast Given 03/04/16 1827)    New Prescriptions   ONDANSETRON (ZOFRAN ODT) 4 MG DISINTEGRATING TABLET    Take 1 tablet (4 mg total) by mouth every 8 (eight) hours as needed for nausea.      Noemi Chapel, MD 03/04/16 [phone]

## 2016-03-04 NOTE — Progress Notes (Signed)
By signing my name below I, Tereasa Coop, attest that this documentation has been prepared under the direction and in the presence of Wendie Agreste, MD. Electonically Signed. Tereasa Coop, Scribe 03/04/2016 at 10:51 AM   Subjective:    Patient ID: Holly Spencer, female    DOB: [birth year], 55 y.o.   MRN: [id number]  Chief Complaint  Patient presents with  . Diabetes    uncontrolled per patient, sugar reading over 300 x 2-3 days with nausea, vomiting,and diarrhea     HPI SOLEIA BADOLATO is a 55 y.o. female who presents to the [facility] complaining of hyperglycemia. Pt reports her sugar has been over 300 for the past week. Pt has history of DM. Pt also c/o N/V/D for the past 3 days. Pt has not been able to hold down any of her medication due to the vomiting. Pt vomited 10 times yesterday and twice this morning. Symptoms started while pt was not eating properly while on vacation. Pt states she was drinking orange juice and eating watermelon. Pt states sugar has not gone down with treatment. Pt states she was not taking her medication properly while on vacation. Pt denies any known sick contacts.   Pt denies fever. Pt also reports diaphoresis, abd pain, and CP. Pt had central sharp CP once only yesterday. CP lasted for a few seconds. Pt denies having any SOB, diaphoresis, radiation of pain or numbness with the CP. Pt denies CP currently. Pt has been urinating normally.   Pt also reports that her "balance has been off" for the past 3 days.   PCP is Eloise Levels, NP. Pt last seen by PCP 4 months ago.   DM type II Takes metformin '750mg'$  QD. Last evaluated at [facility] in 2014. Pt also takes glipizide. Pt has had DM for 7 years. Pt has been admitted for DKA approximately 3 years ago.   HTN Pt takes valsartan-hydrochlorothiazide. Pt states her BP is elevated since she has not been able to hold her medication down.    Patient Active Problem List   Diagnosis Date Noted    . Chest pain, atypical 07/29/2014  . Nausea and vomiting 07/29/2014  . HTN (hypertension), malignant 07/29/2014  . Diabetes mellitus, type II ([place]) 09/21/2011  . Hypertension, uncontrolled 09/21/2011   Past Medical History  Diagnosis Date  . Diabetes mellitus without complication ([place])   . Hypertension    Past Surgical History  Procedure Laterality Date  . Abdominal hysterectomy     No Known Allergies Prior to Admission medications   Medication Sig Start Date End Date Taking? Authorizing Provider  amLODipine (NORVASC) 5 MG tablet Take 5 mg by mouth daily. 10/13/14  Yes Historical Provider, MD  Blood Glucose Monitoring Suppl (BLOOD GLUCOSE METER) kit Use as instructed 05/25/12  Yes Mancel Bale, PA-C  FINGERSTIX LANCETS MISC To check blood sugar tid/ 250.00 dx code uncontrolled diabetes 08/07/13  Yes Robyn Haber, MD  glipiZIDE (GLUCOTROL) 5 MG tablet Take 1 tablet (5 mg total) by mouth 2 (two) times daily before a meal. NO MORE REFILLS WITHOUT OFFICE VISIT - 2ND NOTICE 11/15/14  Yes Dorian Heckle English, PA  Homeopathic Products [facility] ALLERGY RELIEF NA) Place 1 spray into the nose 2 (two) times daily as needed (allergies).   Yes Historical Provider, MD  metFORMIN (GLUCOPHAGE-XR) 750 MG 24 hr tablet Take 1 tablet (750 mg total) by mouth daily. NO MORE REFILLS WITHOUT OFFICE VISIT - 2ND NOTICE 11/15/14  Yes  Dorian Heckle English, PA  valsartan-hydrochlorothiazide (DIOVAN-HCT) 320-12.5 MG per tablet Take 1 tablet by mouth daily. NO MORE REFILLS WITHOUT OFFICE VISIT - 2ND NOTICE 11/15/14  Yes Joretta Bachelor, PA   Social History   Social History  . Marital Status: Married    Spouse Name: N/A  . Number of Children: N/A  . Years of Education: N/A   Occupational History  . Not on file.   Social History Main Topics  . Smoking status: Never Smoker   . Smokeless tobacco: Never Used  . Alcohol Use: No  . Drug Use: No  . Sexual Activity: Not on file   Other Topics Concern  . Not  on file   Social History Narrative   Lives with husband and one of her children in a 2 story home.  Has 3 children.     Works for [employer].     Education: associates degree.      Review of Systems  Constitutional: Negative for fever, fatigue and unexpected weight change.  Respiratory: Negative for chest tightness and shortness of breath.   Cardiovascular: Positive for chest pain. Negative for palpitations and leg swelling.  Gastrointestinal: Positive for nausea, vomiting, abdominal pain and diarrhea. Negative for blood in stool.  Neurological: Positive for dizziness. Negative for syncope, light-headedness and headaches.       Objective:   Physical Exam  Constitutional: She is oriented to person, place, and time. She appears well-developed and well-nourished.  HENT:  Head: Normocephalic and atraumatic.  Mouth/Throat: Oropharynx is clear and moist and mucous membranes are normal.  Eyes: Conjunctivae and EOM are normal. Pupils are equal, round, and reactive to light.  Neck: Carotid bruit is not present.  Cardiovascular: Normal rate, regular rhythm, normal heart sounds and intact distal pulses.  Exam reveals no gallop and no friction rub.   No murmur heard. Pulmonary/Chest: Effort normal and breath sounds normal. She has no decreased breath sounds. She has no wheezes. She has no rhonchi. She has no rales. She exhibits no tenderness and no bony tenderness.  Abdominal: Soft. She exhibits no pulsatile midline mass. There is tenderness in the suprapubic area and left upper quadrant. There is no rebound and no guarding.  Neurological: She is alert and oriented to person, place, and time.  Skin: Skin is warm and dry.  Psychiatric: She has a normal mood and affect. Her behavior is normal.  Vitals reviewed.    Filed Vitals:   03/04/16 0905 03/04/16 0932  BP: 196/100 191/85  Pulse: 72   Temp: 98.1 F (36.7 C)   TempSrc: Oral   Resp: 16   Height: '5\' 5"'$  (1.651 m)    Weight: 196 lb (88.905 kg)   SpO2: 97%    EKG viewed and interpreted by Dr Carlota Raspberry. EKG shows: Sinus rhythm with a rate of 84. Some baseline artifact. Nonspecific ST abnormalities in V1-V5. No other apparent changes from prior EKG (December 2015).   Results for orders placed or performed in visit on 03/04/16  POCT CBC  Result Value Ref Range   WBC 19.7 (A) 4.6 - 10.2 K/uL   Lymph, poc 2.1 0.6 - 3.4   POC LYMPH PERCENT 10.8 10 - 50 %L   MID (cbc) 0.9 0 - 0.9   POC MID % 4.7 0 - 12 %M   POC Granulocyte 16.6 (A) 2 - 6.9   Granulocyte percent 84.5 (A) 37 - 80 %G   RBC 4.91 4.04 - 5.48 M/uL   Hemoglobin 15.6  12.2 - 16.2 g/dL   HCT, POC 43.7 37.7 - 47.9 %   MCV 89.0 80 - 97 fL   MCH, POC 31.7 (A) 27 - 31.2 pg   MCHC 35.6 (A) 31.8 - 35.4 g/dL   RDW, POC 13.9 %   Platelet Count, POC 326 142 - 424 K/uL   MPV 7.8 0 - 99.8 fL  POCT glucose (manual entry)  Result Value Ref Range   POC Glucose 234 (A) 70 - 99 mg/dl  POCT urinalysis dipstick  Result Value Ref Range   Color, UA yellow yellow   Clarity, UA clear clear   Glucose, UA =500 (A) negative   Bilirubin, UA small (A) negative   Ketones, POC UA small (15) (A) negative   Spec Grav, UA >=1.030    Blood, UA moderate (A) negative   pH, UA 5.5    Protein Ur, POC >=300 (A) negative   Urobilinogen, UA 0.2    Nitrite, UA Negative Negative   Leukocytes, UA Negative Negative  POCT Microscopic Urinalysis ([facility])  Result Value Ref Range   WBC,UR,HPF,POC None None WBC/hpf   RBC,UR,HPF,POC None None RBC/hpf   Bacteria None None, Too numerous to count   Mucus Absent Absent   Epithelial Cells, UR Per Microscopy None None, Too numerous to count cells/hpf   1049 hrs Pt states that her nausea is imrpoved after zofran. Pt still has abd tenderness. Upon reevaluation, Pt is Positive at McBurney's point and LUQ. Discussed option with pt for IV placement and EMS transport. Pt preferred private vehicle transport with husband and states she will go  straight to [facility] ED.     Assessment & Plan:   ALEESHA RINGSTAD is a 55 y.o. female Non-intractable vomiting with nausea, vomiting of unspecified type - Plan: POCT urinalysis dipstick, POCT Microscopic Urinalysis ([facility]), ondansetron (ZOFRAN-ODT) disintegrating tablet 4 mg  Generalized abdominal pain - Plan: POCT CBC, POCT urinalysis dipstick, POCT Microscopic Urinalysis ([facility])  Hyperglycemia - Plan: POCT glucose (manual entry), POCT urinalysis dipstick, POCT Microscopic Urinalysis ([facility])  Chest pain, unspecified chest pain type - Plan: EKG 12-Lead  History of diabetes with ketoacidosis  Leukocytosis  3-4 day history of nausea, vomiting, diarrhea and abdominal pain, with some associated dizziness and hyperglycemia. Difficulty tolerating PO including medications which may explain some of her hyperglycemia and hypertension. Initial concern of DKA with history of same in the past, but with leukocytosis and areas of abdominal pain, differential includes pancreatic otitis or appendicitis. Slight improvement with Zofran office.  -Discussed IV fluid and EMS transport, but was declined, Will proceed to ER via private vehicle, husband will take her after leaving our office. First nurse at [facility] ER was advised.  Meds ordered this encounter  Medications  . ondansetron (ZOFRAN-ODT) disintegrating tablet 4 mg    Sig:    Patient Instructions       IF you received an x-ray today, you will receive an invoice from [facility]. Please contact [facility] at [phone] with questions or concerns regarding your invoice.   IF you received labwork today, you will receive an invoice from [employer]. Please contact [facility] at [phone] with questions or concerns regarding your invoice.   Our billing staff will not be able to assist you with questions regarding bills from these companies.  You will be contacted with the lab results as soon as  they are available. The fastest way to get your results is to activate your My Chart account. Instructions are  located on the last page of this paperwork. If you have not heard from [place] regarding the results in 2 weeks, please contact this office.         IF you received an x-ray today, you will receive an invoice from [facility]. Please contact [facility] at [phone] with questions or concerns regarding your invoice.   IF you received labwork today, you will receive an invoice from [employer]. Please contact [facility] at [phone] with questions or concerns regarding your invoice.   Our billing staff will not be able to assist you with questions regarding bills from these companies.  You will be contacted with the lab results as soon as they are available. The fastest way to get your results is to activate your My Chart account. Instructions are located on the last page of this paperwork. If you have not heard from [place] regarding the results in 2 weeks, please contact this office.   We recommend that you schedule a mammogram for breast cancer screening. Typically, you do not need a referral to do this. Please contact a local imaging center to schedule your mammogram.  [facility] - [phone]  *ask for the Radiology [place] ([place]) - [phone] or [phone]  [facility] - [phone] [place] [phone] [facility] - [phone]  *ask for the [facility] - [phone]  *ask for the Radiology Department [facility] - [phone]  *ask for the Mammography Department [facility] - [phone]   Go directly to [facility] emergency room after leaving our office for further evaluation of your abdominal pain, elevated white blood cell count and blood sugar, as well as the nausea and  vomiting. I will let the staff know that you're on the way.      I personally performed the services described in this documentation, which was scribed in my presence. The recorded information has been reviewed and considered, and addended by me as needed.   Signed,   Merri Ray, MD [facility].  03/04/2016 10:55 AM

## 2016-03-04 NOTE — Patient Instructions (Addendum)
     IF you received an x-ray today, you will receive an invoice from [facility]. Please contact [facility] at [phone] with questions or concerns regarding your invoice.   IF you received labwork today, you will receive an invoice from [facility]. Please contact [facility] at [phone] with questions or concerns regarding your invoice.   Our billing staff will not be able to assist you with questions regarding bills from these companies.  You will be contacted with the lab results as soon as they are available. The fastest way to get your results is to activate your My Chart account. Instructions are located on the last page of this paperwork. If you have not heard from us regarding the results in 2 weeks, please contact this office.         IF you received an x-ray today, you will receive an invoice from [facility]. Please contact [facility] at [phone] with questions or concerns regarding your invoice.   IF you received labwork today, you will receive an invoice from [facility]. Please contact [facility] at [phone] with questions or concerns regarding your invoice.   Our billing staff will not be able to assist you with questions regarding bills from these companies.  You will be contacted with the lab results as soon as they are available. The fastest way to get your results is to activate your My Chart account. Instructions are located on the last page of this paperwork. If you have not heard from us regarding the results in 2 weeks, please contact this office.   We recommend that you schedule a mammogram for breast cancer screening. Typically, you do not need a referral to do this. Please contact a local imaging center to schedule your mammogram.  [facility] - [phone]  *ask for the Radiology Department [facility] - [phone] or  [phone]  [facility] - [phone] [facility] - [phone]) [phone] [facility] - [phone](336) [phone]  *ask for the Radiology Department [facility] - [phone](336) [phone]  *ask for the Radiology Department [facility] - [phone]) [phone]  *ask for the Mammography Department [facility] - [phone]   Go directly to [facility] emergency room after leaving our office for further evaluation of your abdominal pain, elevated white blood cell count and blood sugar, as well as the nausea and vomiting. I will let the staff know that you're on the way.

## 2016-03-04 NOTE — Discharge Instructions (Signed)

## 2016-03-04 NOTE — ED Notes (Signed)
CBG [id number] 

## 2016-03-06 LAB — URINE CULTURE

## 2016-03-13 ENCOUNTER — Telehealth: Payer: Self-pay

## 2016-03-13 NOTE — Telephone Encounter (Signed)
Patient needs her FMLA forms completed by Dr Neva Seat, based off her last visit for her illness with her diabetes. I have completed what I could from the OV notes and highlighted the areas that need to be completed. I will place them in your box on 03/13/16 if you could return them to the FMLA/Disability box at the 102 checkout desk within 5-7 business days. Thank you!

## 2016-03-15 DIAGNOSIS — Z0271 Encounter for disability determination: Secondary | ICD-10-CM

## 2016-03-18 NOTE — Telephone Encounter (Signed)
Completed.

## 2016-03-18 NOTE — Telephone Encounter (Signed)
Paperwork scanned and called patient to come pick up her copy on 03/18/16

## 2016-04-13 ENCOUNTER — Other Ambulatory Visit: Payer: Self-pay | Admitting: Physician Assistant

## 2017-04-23 ENCOUNTER — Other Ambulatory Visit: Payer: Self-pay | Admitting: Obstetrics and Gynecology

## 2017-04-23 DIAGNOSIS — Z1231 Encounter for screening mammogram for malignant neoplasm of breast: Secondary | ICD-10-CM

## 2017-06-17 ENCOUNTER — Encounter: Payer: Managed Care, Other (non HMO) | Admitting: Obstetrics and Gynecology

## 2017-06-30 ENCOUNTER — Other Ambulatory Visit: Payer: Self-pay

## 2017-06-30 ENCOUNTER — Ambulatory Visit (INDEPENDENT_AMBULATORY_CARE_PROVIDER_SITE_OTHER): Payer: Managed Care, Other (non HMO) | Admitting: Family Medicine

## 2017-06-30 ENCOUNTER — Encounter: Payer: Self-pay | Admitting: Family Medicine

## 2017-06-30 VITALS — BP 140/86 | HR 76 | Temp 98.7°F | Resp 16 | Ht 66.0 in | Wt 209.4 lb

## 2017-06-30 DIAGNOSIS — Z0000 Encounter for general adult medical examination without abnormal findings: Secondary | ICD-10-CM

## 2017-06-30 DIAGNOSIS — Z23 Encounter for immunization: Secondary | ICD-10-CM

## 2017-06-30 DIAGNOSIS — E1165 Type 2 diabetes mellitus with hyperglycemia: Secondary | ICD-10-CM | POA: Diagnosis not present

## 2017-06-30 DIAGNOSIS — Z1231 Encounter for screening mammogram for malignant neoplasm of breast: Secondary | ICD-10-CM

## 2017-06-30 DIAGNOSIS — I10 Essential (primary) hypertension: Secondary | ICD-10-CM | POA: Diagnosis not present

## 2017-06-30 DIAGNOSIS — Z1239 Encounter for other screening for malignant neoplasm of breast: Secondary | ICD-10-CM

## 2017-06-30 DIAGNOSIS — Z1211 Encounter for screening for malignant neoplasm of colon: Secondary | ICD-10-CM | POA: Diagnosis not present

## 2017-06-30 DIAGNOSIS — Z13220 Encounter for screening for lipoid disorders: Secondary | ICD-10-CM

## 2017-06-30 MED ORDER — GLIPIZIDE 5 MG PO TABS: 5.0000 mg | ORAL_TABLET | Freq: Two times a day (BID) | ORAL | 1 refills | Status: DC

## 2017-06-30 MED ORDER — VALSARTAN-HYDROCHLOROTHIAZIDE 320-12.5 MG PO TABS: 1.0000 | ORAL_TABLET | Freq: Every day | ORAL | 1 refills | Status: DC

## 2017-06-30 MED ORDER — AMLODIPINE BESYLATE 5 MG PO TABS: 5.0000 mg | ORAL_TABLET | Freq: Every day | ORAL | 1 refills | Status: DC

## 2017-06-30 MED ORDER — METFORMIN HCL ER 750 MG PO TB24: 750.0000 mg | ORAL_TABLET | Freq: Every day | ORAL | 1 refills | Status: DC

## 2017-06-30 NOTE — Patient Instructions (Addendum)
Thanks for coming in today.   Schedule appointment with diabetes doctor, and eye care provider as soon as possible. I will check some blood work  today.   Restart same medications for now, and recheck in next 2-3 weeks to make sure you are on correct medications and can review labs.   Keeping You Healthy  Get These Tests  Blood Pressure- Have your blood pressure checked by your healthcare provider at least once a year.  Normal blood pressure is 120/80.  Weight- Have your body mass index (BMI) calculated to screen for obesity.  BMI is a measure of body fat based on height and weight.  You can calculate your own BMI at [employer]  Cholesterol- Have your cholesterol checked every year.  Diabetes- Have your blood sugar checked every year if you have high blood pressure, high cholesterol, a family history of diabetes or if you are overweight.  Pap Test - Have a pap test every 1 to 5 years if you have been sexually active.  If you are older than 65 and recent pap tests have been normal you may not need additional pap tests.  In addition, if you have had a hysterectomy  for benign disease additional pap tests are not necessary.  Mammogram-Yearly mammograms are essential for early detection of breast cancer  Screening for Colon Cancer- Colonoscopy starting at age 39. Screening may begin sooner depending on your family history and other health conditions.  Follow up colonoscopy as directed by your Gastroenterologist.  Screening for Osteoporosis- Screening begins at age 6 with bone density scanning, sooner if you are at higher risk for developing Osteoporosis.  Get these medicines  Calcium with Vitamin D- Your body requires 1200-1500 mg of Calcium a day and [phone] IU of Vitamin D a day.  You can only absorb 500 mg of Calcium at a time therefore Calcium must be taken in 2 or 3 separate doses throughout the day.  Hormones- Hormone therapy has been associated with increased risk  for certain cancers and heart disease.  Talk to your healthcare provider about if you need relief from menopausal symptoms.  Aspirin- Ask your healthcare provider about taking Aspirin to prevent Heart Disease and Stroke.  Get these Immuniztions  Flu shot- Every fall  Pneumonia shot- Once after the age of 50; if you are younger ask your healthcare provider if you need a pneumonia shot.  Tetanus- Every ten years.  Zostavax- Once after the age of 30 to prevent shingles.  Take these steps  Don't smoke- Your healthcare provider can help you quit. For tips on how to quit, ask your healthcare provider or go to www.smokefree.gov or call 1-800 QUIT-NOW.  Be physically active- Exercise 5 days a week for a minimum of 30 minutes.  If you are not already physically active, start slow and gradually work up to 30 minutes of moderate physical activity.  Try walking, dancing, bike riding, swimming, etc.  Eat a healthy diet- Eat a variety of healthy foods such as fruits, vegetables, whole grains, low fat milk, low fat cheeses, yogurt, lean meats, chicken, fish, eggs, dried beans, tofu, etc.  For more information go to www.thenutritionsource.org  Dental visit- Brush and floss teeth twice daily; visit your dentist twice a year.  Eye exam- Visit your Optometrist or Ophthalmologist yearly.  Drink alcohol in moderation- Limit alcohol intake to one drink or less a day.  Never drink and drive.  Depression- Your emotional health is as important as your physical health.  If  you're feeling down or losing interest in things you normally enjoy, please talk to your healthcare provider.  Seat Belts- can save your life; always wear one  Smoke/Carbon Monoxide detectors- These detectors need to be installed on the appropriate level of your home.  Replace batteries at least once a year.  Violence- If anyone is threatening or hurting you, please tell your healthcare provider.  Living Will/ Health care power of  attorney- Discuss with your healthcare provider and family. Diabetes Mellitus and Standards of Medical Care Managing diabetes (diabetes mellitus) can be complicated. Your diabetes treatment may be managed by a team of health care providers, including:  A diet and nutrition specialist (registered dietitian).  A nurse.  A certified diabetes educator (CDE).  A diabetes specialist (endocrinologist).  An eye doctor.  A primary care provider.  A dentist.  Your health care providers follow a schedule in order to help you get the best quality of care. The following schedule is a general guideline for your diabetes management plan. Your health care providers may also give you more specific instructions. HbA1c ( hemoglobin A1c) test This test provides information about blood sugar (glucose) control over the previous 2-3 months. It is used to check whether your diabetes management plan needs to be adjusted.  If you are meeting your treatment goals, this test is done at least 2 times a year.  If you are not meeting treatment goals or if your treatment goals have changed, this test is done 4 times a year.  Blood pressure test  This test is done at every routine medical visit. For most people, the goal is less than 130/80. Ask your health care provider what your goal blood pressure should be. Dental and eye exams  Visit your dentist two times a year.  If you have type 1 diabetes, get an eye exam 3-5 years after you are diagnosed, and then once a year after your first exam. ? If you were diagnosed with type 1 diabetes as a child, get an eye exam when you are age 73 or older and have had diabetes for 3-5 years. After the first exam, you should get an eye exam once a year.  If you have type 2 diabetes, have an eye exam as soon as you are diagnosed, and then once a year after your first exam. Foot care exam  Visual foot exams are done at every routine medical visit. The exams check for cuts,  bruises, redness, blisters, sores, or other problems with the feet.  A complete foot exam is done by your health care provider once a year. This exam includes an inspection of the structure and skin of your feet, and a check of the pulses and sensation in your feet. ? Type 1 diabetes: Get your first exam 3-5 years after diagnosis. ? Type 2 diabetes: Get your first exam as soon as you are diagnosed.  Check your feet every day for cuts, bruises, redness, blisters, or sores. If you have any of these or other problems that are not healing, contact your health care provider. Kidney function test ( urine microalbumin)  This test is done once a year. ? Type 1 diabetes: Get your first test 5 years after diagnosis. ? Type 2 diabetes: Get your first test as soon as you are diagnosed.  If you have chronic kidney disease (CKD), get a serum creatinine and estimated glomerular filtration rate (eGFR) test once a year. Lipid profile (cholesterol, HDL, LDL, triglycerides)  This test should  be done when you are diagnosed with diabetes, and every 5 years after the first test. If you are on medicines to lower your cholesterol, you may need to get this test done every year. ? The goal for LDL is less than 100 mg/dL (5.5 mmol/L). If you are at high risk, the goal is less than 70 mg/dL (3.9 mmol/L). ? The goal for HDL is 40 mg/dL (2.2 mmol/L) for men and 50 mg/dL(2.8 mmol/L) for women. An HDL cholesterol of 60 mg/dL (3.3 mmol/L) or higher gives some protection against heart disease. ? The goal for triglycerides is less than 150 mg/dL (8.3 mmol/L). Immunizations  The yearly flu (influenza) vaccine is recommended for everyone 6 months or older who has diabetes.  The pneumonia (pneumococcal) vaccine is recommended for everyone 2 years or older who has diabetes. If you are 25 or older, you may get the pneumonia vaccine as a series of two separate shots.  The hepatitis B vaccine is recommended for adults shortly  after they have been diagnosed with diabetes.  The Tdap (tetanus, diphtheria, and pertussis) vaccine should be given: ? According to normal childhood vaccination schedules, for children. ? Every 10 years, for adults who have diabetes.  The shingles vaccine is recommended for people who have had chicken pox and are 50 years or older. Mental and emotional health  Screening for symptoms of eating disorders, anxiety, and depression is recommended at the time of diagnosis and afterward as needed. If your screening shows that you have symptoms (you have a positive screening result), you may need further evaluation and be referred to a mental health care provider. Diabetes self-management education  Education about how to manage your diabetes is recommended at diagnosis and ongoing as needed. Treatment plan  Your treatment plan will be reviewed at every medical visit. Summary  Managing diabetes (diabetes mellitus) can be complicated. Your diabetes treatment may be managed by a team of health care providers.  Your health care providers follow a schedule in order to help you get the best quality of care.  Standards of care including having regular physical exams, blood tests, blood pressure monitoring, immunizations, screening tests, and education about how to manage your diabetes.  Your health care providers may also give you more specific instructions based on your individual health. This information is not intended to replace advice given to you by your health care provider. Make sure you discuss any questions you have with your health care provider. Document Released: 06/02/2009 Document Revised: 05/03/2016 Document Reviewed: 05/03/2016 Elsevier Interactive Patient Education  2018 [employer].   IF you received an x-ray today, you will receive an invoice from [facility]. Please contact [facility] at [phone] with questions or concerns regarding your invoice.   IF  you received labwork today, you will receive an invoice from [facility]. Please contact [facility] at [phone] with questions or concerns regarding your invoice.   Our billing staff will not be able to assist you with questions regarding bills from these companies.  You will be contacted with the lab results as soon as they are available. The fastest way to get your results is to activate your My Chart account. Instructions are located on the last page of this paperwork. If you have not heard from [place] regarding the results in 2 weeks, please contact this office.

## 2017-06-30 NOTE — Progress Notes (Signed)
Subjective:  This chart was scribed for Holly Agreste, MD by Tamsen Roers, at [place] at [facility].  This patient was seen in room 8 and the patient's care was started at 8:24 AM.    Chief Complaint  Patient presents with  . Annual Exam    needs referral for mammogram and colonoscopy     Patient ID: Holly Spencer, female    DOB: Jul 15, 1961, 56 y.o.   MRN: [id number]  HPI HPI Comments: Holly Spencer is a 56 y.o. female who presents to Primary Care at [facility] for a physical. Patient has a history of diabetes and hypertension. Previous PCP is Eloise Levels. --- Patients last visit with a primary care provider was two years ago- states this is due to the location being inconvenient with her job.  Patient is also taking care of her elderly parents as well as her husbands elderly parents (states that this takes up much of her time and can be overwhelming).     Diabetes: She has had diabetes with ketoacidosis in the past.  Glucose was 234 when she was last seen here in July 2017.  When she was seen in 2017, she was on Metformin and Glipizide. ----She is still using the same medications and has been compliant with them (has run out of both medications -for about a week). She has been having home readings ranging from 170 to 200.  Patient is followed by a specialist (Dr. Carlis Abbott - endocrinology - last seen one year ago at [facility]). She will be making an appointment with him for the end of the year. Denies any nausea, vomiting or dizziness. Patient is planning on making an appointment with her eye doctor.    Hypertension: Patient is on Valsartan HCT 320-12.5 mg QD as well as Norvasc 5 mg QD.--- Patient has been out of her blood pressure medication for 1.5 weeks. She has been compliant with them and denies any side effects.     Cancer screening: Colonoscopy: Patient would like a referral for this today.   Breast cancer screening: She is due for Mammogram: Patient would  like a referral for this today.  Denies any history of breast cancer.  Patient has not noticed any new lumps/bumps- performs exams on herself frequently.   Cervical cancer screening: Patient had a partial hysterectomy in 2008 (due to a fibroid) Dr. Quincy Simmonds.  She has not had any pap smears since.    Immunizations:  There is no immunization history on file for this patient. Plans on having flu Tdap and pneumovax today.    Depression:  Depression screen [facility] 2/9 06/30/2017 03/04/2016  Decreased Interest 0 0  Down, Depressed, Hopeless 0 0  PHQ - 2 Score 0 0    Vision:   Visual Acuity Screening   Right eye Left eye Both eyes  Without correction:     With correction: '20/40 20/50 20/40 '$    Exercise: Patient exercises about 2-3 days per week- exercise facility at her work.    Dentist: Patient has a dentist- Dr. Leeann Must.    Patient Active Problem List   Diagnosis Date Noted  . Chest pain, atypical 07/29/2014  . Nausea and vomiting 07/29/2014  . HTN (hypertension), malignant 07/29/2014  . Diabetes mellitus, type II ([place]) 09/21/2011  . Hypertension, uncontrolled 09/21/2011   Past Medical History:  Diagnosis Date  . Diabetes mellitus without complication ([place])   . Hypertension    Past Surgical History:  Procedure Laterality Date  . ABDOMINAL  HYSTERECTOMY     No Known Allergies Prior to Admission medications   Medication Sig Start Date End Date Taking? Authorizing Provider  amLODipine (NORVASC) 5 MG tablet Take 5 mg by mouth daily. 10/13/14   [provider]  Blood Glucose Monitoring Suppl (BLOOD GLUCOSE METER) kit Use as instructed 05/25/12   Gale Journey, Damaris Hippo, PA-C  FINGERSTIX LANCETS MISC To check blood sugar tid/ 250.00 dx code uncontrolled diabetes 08/07/13   Robyn Haber, MD  glipiZIDE (GLUCOTROL) 5 MG tablet Take 1 tablet (5 mg total) by mouth 2 (two) times daily before a meal. 04/15/16   Holly Agreste, MD  Homeopathic Products [facility] ALLERGY RELIEF NA) Place 1 spray  into the nose 2 (two) times daily as needed (allergies).    [provider]  metFORMIN (GLUCOPHAGE-XR) 750 MG 24 hr tablet Take 1 tablet (750 mg total) by mouth daily. NO MORE REFILLS WITHOUT OFFICE VISIT - 2ND NOTICE 11/15/14   Ivar Drape D, PA  ondansetron (ZOFRAN ODT) 4 MG disintegrating tablet Take 1 tablet (4 mg total) by mouth every 8 (eight) hours as needed for nausea. 03/04/16   Noemi Chapel, MD  valsartan-hydrochlorothiazide (DIOVAN-HCT) 320-12.5 MG per tablet Take 1 tablet by mouth daily. NO MORE REFILLS WITHOUT OFFICE VISIT - 2ND NOTICE 11/15/14   Joretta Bachelor, PA   Social History   Socioeconomic History  . Marital status: Married    Spouse name: Not on file  . Number of children: Not on file  . Years of education: Not on file  . Highest education level: Not on file  Social Needs  . Financial resource strain: Not on file  . Food insecurity - worry: Not on file  . Food insecurity - inability: Not on file  . Transportation needs - medical: Not on file  . Transportation needs - non-medical: Not on file  Occupational History  . Not on file  Tobacco Use  . Smoking status: Never Smoker  . Smokeless tobacco: Never Used  Substance and Sexual Activity  . Alcohol use: No    Alcohol/week: 0.0 oz  . Drug use: No  . Sexual activity: Not on file  Other Topics Concern  . Not on file  Social History Narrative   Lives with husband and one of her children in a 2 story home.  Has 3 children.     Works for [employer].     Education: associates degree.      Review of Systems  Constitutional: Negative for fatigue and unexpected weight change.  Respiratory: Negative for chest tightness and shortness of breath.   Cardiovascular: Negative for chest pain, palpitations and leg swelling.  Gastrointestinal: Negative for abdominal pain and blood in stool.  Neurological: Negative for dizziness, syncope, light-headedness and headaches.  All other  systems reviewed and are negative.      Objective:   Physical Exam  Constitutional: She is oriented to person, place, and time. She appears well-developed and well-nourished.  HENT:  Head: Normocephalic and atraumatic.  Right Ear: External ear normal.  Left Ear: External ear normal.  Mouth/Throat: Oropharynx is clear and moist.  Eyes: Conjunctivae are normal. Pupils are equal, round, and reactive to light.  Neck: Normal range of motion. Neck supple. No thyromegaly present.  Cardiovascular: Normal rate, regular rhythm, normal heart sounds and intact distal pulses.  No murmur heard. Pulmonary/Chest: Effort normal and breath sounds normal. No respiratory distress. She has no wheezes. Right breast exhibits no mass and no nipple discharge. Left  breast exhibits no mass and no nipple discharge. Breasts are symmetrical.  Abdominal: Soft. Bowel sounds are normal. There is no tenderness.  Musculoskeletal: Normal range of motion. She exhibits no edema or tenderness.  Lymphadenopathy:    She has no cervical adenopathy.  Neurological: She is alert and oriented to person, place, and time.  Skin: Skin is warm and dry. No rash noted.  Psychiatric: She has a normal mood and affect. Her behavior is normal. Thought content normal.  Vitals reviewed.   Vitals:   06/30/17 0806  BP: (!) 158/84  Pulse: 76  Resp: 16  Temp: 98.7 F (37.1 C)  TempSrc: Oral  SpO2: 98%  Weight: 209 lb 6.4 oz (95 kg)  Height: '5\' 6"'$  (1.676 m)      Assessment & Plan:    FREDI HURTADO is a 56 y.o. female Annual physical exam  -anticipatory guidance as below in AVS, screening labs above. Health maintenance items as above in HPI discussed/recommended as applicable.   Need for prophylactic vaccination against Streptococcus pneumoniae (pneumococcus)  - Unsure of need, suspect she has had this previously, she will check into last date of Pneumovax. Recommended with history of diabetes  Need for prophylactic  vaccination and inoculation against influenza - Plan: Flu Vaccine QUAD 36+ mos IM  given  Need for Tdap vaccination - Plan: Tdap vaccine greater than or equal to 7yo IM given  Screen for colon cancer - Plan: Ambulatory referral to Gastroenterology  Screening for breast cancer - Plan: MM Digital Screening  - Referred for mammogram, stressed importance of routine imaging, no concerning findings on exam  Essential hypertension - Plan: valsartan-hydrochlorothiazide (DIOVAN-HCT) 320-12.5 MG tablet, amLODipine (NORVASC) 5 MG tablet, Comprehensive metabolic panel  - Uncontrolled off medications, restart prior doses and recheck in the next few weeks. goal blood pressure less than 130/80 with history of diabetes Type 2 diabetes mellitus with hyperglycemia, without long-term current use of insulin (HCC) - Plan: metFORMIN (GLUCOPHAGE-XR) 750 MG 24 hr tablet, glipiZIDE (GLUCOTROL) 5 MG tablet, Hemoglobin A1c, Microalbumin, urine  Screening for hyperlipidemia - Plan: Lipid panel    Meds ordered this encounter  Medications  . valsartan-hydrochlorothiazide (DIOVAN-HCT) 320-12.5 MG tablet    Sig: Take 1 tablet daily by mouth.    Dispense:  90 tablet    Refill:  1  . metFORMIN (GLUCOPHAGE-XR) 750 MG 24 hr tablet    Sig: Take 1 tablet (750 mg total) daily by mouth.    Dispense:  90 tablet    Refill:  1  . glipiZIDE (GLUCOTROL) 5 MG tablet    Sig: Take 1 tablet (5 mg total) 2 (two) times daily before a meal by mouth.    Dispense:  180 tablet    Refill:  1  . amLODipine (NORVASC) 5 MG tablet    Sig: Take 1 tablet (5 mg total) daily by mouth.    Dispense:  90 tablet    Refill:  1   Patient Instructions   Thanks for coming in today.   Schedule appointment with diabetes doctor, and eye care provider as soon as possible. I will check some blood work  today.   Restart same medications for now, and recheck in next 2-3 weeks to make sure you are on correct medications and can review labs.   Keeping  You Healthy  Get These Tests  Blood Pressure- Have your blood pressure checked by your healthcare provider at least once a year.  Normal blood pressure is 120/80.  Weight- Have  your body mass index (BMI) calculated to screen for obesity.  BMI is a measure of body fat based on height and weight.  You can calculate your own BMI at [employer].it  Cholesterol- Have your cholesterol checked every year.  Diabetes- Have your blood sugar checked every year if you have high blood pressure, high cholesterol, a family history of diabetes or if you are overweight.  Pap Test - Have a pap test every 1 to 5 years if you have been sexually active.  If you are older than 65 and recent pap tests have been normal you may not need additional pap tests.  In addition, if you have had a hysterectomy  for benign disease additional pap tests are not necessary.  Mammogram-Yearly mammograms are essential for early detection of breast cancer  Screening for Colon Cancer- Colonoscopy starting at age 21. Screening may begin sooner depending on your family history and other health conditions.  Follow up colonoscopy as directed by your Gastroenterologist.  Screening for Osteoporosis- Screening begins at age 63 with bone density scanning, sooner if you are at higher risk for developing Osteoporosis.  Get these medicines  Calcium with Vitamin D- Your body requires 1200-1500 mg of Calcium a day and [phone] IU of Vitamin D a day.  You can only absorb 500 mg of Calcium at a time therefore Calcium must be taken in 2 or 3 separate doses throughout the day.  Hormones- Hormone therapy has been associated with increased risk for certain cancers and heart disease.  Talk to your healthcare provider about if you need relief from menopausal symptoms.  Aspirin- Ask your healthcare provider about taking Aspirin to prevent Heart Disease and Stroke.  Get these Immuniztions  Flu shot- Every fall  Pneumonia shot- Once after  the age of 65; if you are younger ask your healthcare provider if you need a pneumonia shot.  Tetanus- Every ten years.  Zostavax- Once after the age of 67 to prevent shingles.  Take these steps  Don't smoke- Your healthcare provider can help you quit. For tips on how to quit, ask your healthcare provider or go to www.smokefree.gov or call 1-800 QUIT-NOW.  Be physically active- Exercise 5 days a week for a minimum of 30 minutes.  If you are not already physically active, start slow and gradually work up to 30 minutes of moderate physical activity.  Try walking, dancing, bike riding, swimming, etc.  Eat a healthy diet- Eat a variety of healthy foods such as fruits, vegetables, whole grains, low fat milk, low fat cheeses, yogurt, lean meats, chicken, fish, eggs, dried beans, tofu, etc.  For more information go to www.thenutritionsource.org  Dental visit- Brush and floss teeth twice daily; visit your dentist twice a year.  Eye exam- Visit your Optometrist or Ophthalmologist yearly.  Drink alcohol in moderation- Limit alcohol intake to one drink or less a day.  Never drink and drive.  Depression- Your emotional health is as important as your physical health.  If you're feeling down or losing interest in things you normally enjoy, please talk to your healthcare provider.  Seat Belts- can save your life; always wear one  Smoke/Carbon Monoxide detectors- These detectors need to be installed on the appropriate level of your home.  Replace batteries at least once a year.  Violence- If anyone is threatening or hurting you, please tell your healthcare provider.  Living Will/ Health care power of attorney- Discuss with your healthcare provider and family. Diabetes Mellitus and Standards of Medical Care  Managing diabetes (diabetes mellitus) can be complicated. Your diabetes treatment may be managed by a team of health care providers, including:  A diet and nutrition specialist (registered  dietitian).  A nurse.  A certified diabetes educator (CDE).  A diabetes specialist (endocrinologist).  An eye doctor.  A primary care provider.  A dentist.  Your health care providers follow a schedule in order to help you get the best quality of care. The following schedule is a general guideline for your diabetes management plan. Your health care providers may also give you more specific instructions. HbA1c ( hemoglobin A1c) test This test provides information about blood sugar (glucose) control over the previous 2-3 months. It is used to check whether your diabetes management plan needs to be adjusted.  If you are meeting your treatment goals, this test is done at least 2 times a year.  If you are not meeting treatment goals or if your treatment goals have changed, this test is done 4 times a year.  Blood pressure test  This test is done at every routine medical visit. For most people, the goal is less than 130/80. Ask your health care provider what your goal blood pressure should be. Dental and eye exams  Visit your dentist two times a year.  If you have type 1 diabetes, get an eye exam 3-5 years after you are diagnosed, and then once a year after your first exam. ? If you were diagnosed with type 1 diabetes as a child, get an eye exam when you are age 64 or older and have had diabetes for 3-5 years. After the first exam, you should get an eye exam once a year.  If you have type 2 diabetes, have an eye exam as soon as you are diagnosed, and then once a year after your first exam. Foot care exam  Visual foot exams are done at every routine medical visit. The exams check for cuts, bruises, redness, blisters, sores, or other problems with the feet.  A complete foot exam is done by your health care provider once a year. This exam includes an inspection of the structure and skin of your feet, and a check of the pulses and sensation in your feet. ? Type 1 diabetes: Get your first  exam 3-5 years after diagnosis. ? Type 2 diabetes: Get your first exam as soon as you are diagnosed.  Check your feet every day for cuts, bruises, redness, blisters, or sores. If you have any of these or other problems that are not healing, contact your health care provider. Kidney function test ( urine microalbumin)  This test is done once a year. ? Type 1 diabetes: Get your first test 5 years after diagnosis. ? Type 2 diabetes: Get your first test as soon as you are diagnosed.  If you have chronic kidney disease (CKD), get a serum creatinine and estimated glomerular filtration rate (eGFR) test once a year. Lipid profile (cholesterol, HDL, LDL, triglycerides)  This test should be done when you are diagnosed with diabetes, and every 5 years after the first test. If you are on medicines to lower your cholesterol, you may need to get this test done every year. ? The goal for LDL is less than 100 mg/dL (5.5 mmol/L). If you are at high risk, the goal is less than 70 mg/dL (3.9 mmol/L). ? The goal for HDL is 40 mg/dL (2.2 mmol/L) for men and 50 mg/dL(2.8 mmol/L) for women. An HDL cholesterol of 60 mg/dL (3.3 mmol/L)  or higher gives some protection against heart disease. ? The goal for triglycerides is less than 150 mg/dL (8.3 mmol/L). Immunizations  The yearly flu (influenza) vaccine is recommended for everyone 6 months or older who has diabetes.  The pneumonia (pneumococcal) vaccine is recommended for everyone 2 years or older who has diabetes. If you are 55 or older, you may get the pneumonia vaccine as a series of two separate shots.  The hepatitis B vaccine is recommended for adults shortly after they have been diagnosed with diabetes.  The Tdap (tetanus, diphtheria, and pertussis) vaccine should be given: ? According to normal childhood vaccination schedules, for children. ? Every 10 years, for adults who have diabetes.  The shingles vaccine is recommended for people who have had  chicken pox and are 50 years or older. Mental and emotional health  Screening for symptoms of eating disorders, anxiety, and depression is recommended at the time of diagnosis and afterward as needed. If your screening shows that you have symptoms (you have a positive screening result), you may need further evaluation and be referred to a mental health care provider. Diabetes self-management education  Education about how to manage your diabetes is recommended at diagnosis and ongoing as needed. Treatment plan  Your treatment plan will be reviewed at every medical visit. Summary  Managing diabetes (diabetes mellitus) can be complicated. Your diabetes treatment may be managed by a team of health care providers.  Your health care providers follow a schedule in order to help you get the best quality of care.  Standards of care including having regular physical exams, blood tests, blood pressure monitoring, immunizations, screening tests, and education about how to manage your diabetes.  Your health care providers may also give you more specific instructions based on your individual health. This information is not intended to replace advice given to you by your health care provider. Make sure you discuss any questions you have with your health care provider. Document Released: 06/02/2009 Document Revised: 05/03/2016 Document Reviewed: 05/03/2016 Elsevier Interactive Patient Education  2018 [employer].    IF you received an x-ray today, you will receive an invoice from [facility]. Please contact [facility] at [phone] with questions or concerns regarding your invoice.   IF you received labwork today, you will receive an invoice from [facility]. Please contact [facility] at [phone] with questions or concerns regarding your invoice.   Our billing staff will not be able to assist you with questions regarding bills from these companies.  You will be contacted with  the lab results as soon as they are available. The fastest way to get your results is to activate your My Chart account. Instructions are located on the last page of this paperwork. If you have not heard from [place] regarding the results in 2 weeks, please contact this office.    I personally performed the services described in this documentation, which was scribed in my presence. The recorded information has been reviewed and considered for accuracy and completeness, addended by me as needed, and agree with information above.  Signed,   Merri Ray, MD Primary Care at [place].  06/30/17 8:51 AM

## 2017-07-01 LAB — COMPREHENSIVE METABOLIC PANEL
A/G RATIO: 1.6 (ref 1.2–2.2)
ALK PHOS: 112 IU/L (ref 39–117)
ALT: 18 IU/L (ref 0–32)
AST: 12 IU/L (ref 0–40)
Albumin: 4.1 g/dL (ref 3.5–5.5)
BILIRUBIN TOTAL: 0.2 mg/dL (ref 0.0–1.2)
BUN / CREAT RATIO: 13 (ref 9–23)
BUN: 11 mg/dL (ref 6–24)
CHLORIDE: 102 mmol/L (ref 96–106)
CO2: 26 mmol/L (ref 20–29)
Calcium: 9.5 mg/dL (ref 8.7–10.2)
Creatinine, Ser: 0.87 mg/dL (ref 0.57–1.00)
GFR calc Af Amer: 87 mL/min/{1.73_m2} (ref >59)
GFR calc non Af Amer: 75 mL/min/{1.73_m2} (ref >59)
GLOBULIN, TOTAL: 2.6 g/dL (ref 1.5–4.5)
Glucose: 122 mg/dL — ABNORMAL HIGH (ref 65–99)
POTASSIUM: 4.3 mmol/L (ref 3.5–5.2)
Sodium: 141 mmol/L (ref 134–144)
TOTAL PROTEIN: 6.7 g/dL (ref 6.0–8.5)

## 2017-07-01 LAB — LIPID PANEL
CHOL/HDL RATIO: 5.6 ratio — AB (ref 0.0–4.4)
Cholesterol, Total: 251 mg/dL — ABNORMAL HIGH (ref 100–199)
HDL: 45 mg/dL (ref >39)
LDL Calculated: 162 mg/dL — ABNORMAL HIGH (ref 0–99)
Triglycerides: 219 mg/dL — ABNORMAL HIGH (ref 0–149)
VLDL Cholesterol Cal: 44 mg/dL — ABNORMAL HIGH (ref 5–40)

## 2017-07-01 LAB — HEMOGLOBIN A1C
Est. average glucose Bld gHb Est-mCnc: 183 mg/dL
HEMOGLOBIN A1C: 8 % — AB (ref 4.8–5.6)

## 2017-07-01 LAB — MICROALBUMIN, URINE: Microalbumin, Urine: <3 ug/mL

## 2017-07-08 ENCOUNTER — Telehealth: Payer: Self-pay | Admitting: Family Medicine

## 2017-07-08 DIAGNOSIS — Z1239 Encounter for other screening for malignant neoplasm of breast: Secondary | ICD-10-CM

## 2017-07-08 NOTE — Telephone Encounter (Signed)
Please clarify reason for diagnostic. Prior mammogram in 03/2007 had no mammographic evidence of malignancy. Scattered fibroglandular densities noted, but no other apparent abnormal findings or recommendation for diagnostic that I can see.

## 2017-07-08 NOTE — Telephone Encounter (Signed)
Copied from CRM [id number]. Topic: Quick Communication - See Telephone Encounter >> Jul 08, 2017 12:58 PM Floria RavelingStovall, Shana A wrote: CRM for notification. See Telephone encounter for: pt called in and said that [facility] and they told her due to the notes the order would need to be for a diagnotic mammogram instead of a routine. Can the order to [facility] be changed to Diagnotic?     07/08/17.

## 2017-07-08 NOTE — Telephone Encounter (Signed)
Spoke with Holly Spencer at [facility]. She stated when they called to schedule pt's screening mammogram, pt stated she had knots near her armpit and this means the order has to be changed.

## 2017-07-08 NOTE — Telephone Encounter (Signed)
Please see note below. 

## 2017-07-09 NOTE — Telephone Encounter (Signed)
Ok, noted. New order has been placed.

## 2017-07-14 ENCOUNTER — Telehealth: Payer: Self-pay | Admitting: *Deleted

## 2017-07-14 NOTE — Telephone Encounter (Signed)
Left message to call back for results

## 2017-07-17 ENCOUNTER — Telehealth: Payer: Self-pay | Admitting: *Deleted

## 2017-07-17 NOTE — Telephone Encounter (Signed)
LMOM for pt to call regarding lab results. 

## 2017-07-24 ENCOUNTER — Ambulatory Visit: Payer: Managed Care, Other (non HMO) | Admitting: Family Medicine

## 2017-07-24 ENCOUNTER — Encounter: Payer: Self-pay | Admitting: Radiology

## 2017-07-30 ENCOUNTER — Encounter: Payer: Self-pay | Admitting: Family Medicine

## 2017-08-14 ENCOUNTER — Telehealth: Payer: Self-pay | Admitting: Family Medicine

## 2017-08-14 NOTE — Telephone Encounter (Signed)
Holly Spencer from [facility] called concerning the new order placed by Dr. Neva SeatGreene for diagnostic mammogram. She stated the diagnosis for this exam is still a screening for breast cancer and this will need to be changed to a diagnosis for breast mass. Please advise. Thanks!

## 2017-08-15 NOTE — Telephone Encounter (Signed)
I do not mind changing diagnosis, but need to know more details of mass or lump in question as I do not see where that was noted at her last physical with me.  Was that noted elsewhere?

## 2017-08-20 NOTE — Telephone Encounter (Signed)
Spoke with pt.  She states she has "knots" she thought were due to anti-perspirant.  They do not go away.  She has watched for a while now.  She will go ahead and have the diagnostic mammogram to rule out any pathology.

## 2017-10-17 ENCOUNTER — Telehealth: Payer: Self-pay | Admitting: Family Medicine

## 2017-10-17 NOTE — Telephone Encounter (Signed)
Copied from CRM [id number]. Topic: Referral - Question >> Oct 17, 2017  2:31 PM Holly Spencer, Holly Spencer wrote: Reason for CRM: Maralyn SagoSarah with [facility] [place] called about an order that they have for patient for screening mammogram that was put in back in December and would like to know if they will stay the same or change the diagnosis of order. She can be reached at  [phone]. [phone]  and if she does not answer to leave vm with the correct order and instruction.

## 2017-10-18 ENCOUNTER — Other Ambulatory Visit: Payer: Self-pay

## 2017-10-18 DIAGNOSIS — N6099 Unspecified benign mammary dysplasia of unspecified breast: Secondary | ICD-10-CM

## 2017-10-18 HISTORY — DX: Unspecified benign mammary dysplasia of unspecified breast: N60.99

## 2017-10-18 NOTE — Telephone Encounter (Signed)
[facility] - should be diagnostic due to pt complaints of 'knots' in breast. Diagnostic order placed.

## 2017-10-20 ENCOUNTER — Telehealth: Payer: Self-pay | Admitting: Family Medicine

## 2017-10-20 DIAGNOSIS — Z1239 Encounter for other screening for malignant neoplasm of breast: Secondary | ICD-10-CM

## 2017-10-20 NOTE — Telephone Encounter (Signed)
Copied from CRM [id number]. Topic: Referral - Question >> Oct 17, 2017  2:31 PM Louie BunPalacios Medina, Rosey Batheresa D wrote: Reason for CRM: Maralyn SagoSarah with [facility] [place] called about an order that they have for patient for screening mammogram that was put in back in December and would like to know if they will stay the same or change the diagnosis of order. She can be reached at  [phone]. [phone]  and if she does not answer to leave vm with the correct order and instruction.   >> Oct 20, 2017  2:31 PM Lelon FrohlichGolden, Sahil Milner, RMA wrote: Maralyn SagoSarah called back needing clarification on which breast please call her @[phone]  ext [phone]

## 2017-11-04 ENCOUNTER — Other Ambulatory Visit: Payer: Self-pay | Admitting: Family Medicine

## 2017-11-04 DIAGNOSIS — I10 Essential (primary) hypertension: Secondary | ICD-10-CM

## 2017-11-04 DIAGNOSIS — E1165 Type 2 diabetes mellitus with hyperglycemia: Secondary | ICD-10-CM

## 2017-11-04 NOTE — Telephone Encounter (Signed)
Valsartan-HCTZ refill Last OV: 06/30/17 Last Refill:06/30/17 [facility] [address] PCP: Meredith StaggersJeffrey Greene MD

## 2017-11-13 NOTE — Telephone Encounter (Signed)
[facility] call to [facility], spoke with Enbridge EnergyBrittney. She states patient needs order updated with information regarding right breast.   Order pended for provider signature.

## 2017-11-13 NOTE — Telephone Encounter (Signed)
Patient called back and it is her right breast.

## 2017-11-13 NOTE — Telephone Encounter (Signed)
Called pt to clarify which breast has the knots in it pt was not available to speak with at the moment and left a message with her husband gregory for her to call back..Marland Kitchen

## 2017-11-14 ENCOUNTER — Other Ambulatory Visit: Payer: Self-pay | Admitting: Family Medicine

## 2017-11-14 DIAGNOSIS — R234 Changes in skin texture: Secondary | ICD-10-CM

## 2017-11-14 DIAGNOSIS — Z1239 Encounter for other screening for malignant neoplasm of breast: Secondary | ICD-10-CM

## 2017-11-14 NOTE — Telephone Encounter (Signed)
signed

## 2017-11-17 ENCOUNTER — Ambulatory Visit
Admission: RE | Admit: 2017-11-17 | Discharge: 2017-11-17 | Disposition: A | Discharge status: Home / Self Care | Payer: 59 | Source: Ambulatory Visit | Attending: Family Medicine | Admitting: Family Medicine

## 2017-11-17 ENCOUNTER — Ambulatory Visit
Admission: RE | Admit: 2017-11-17 | Discharge: 2017-11-17 | Disposition: A | Discharge status: Home / Self Care | Payer: Managed Care, Other (non HMO) | Source: Ambulatory Visit | Attending: Family Medicine | Admitting: Family Medicine

## 2017-11-17 ENCOUNTER — Other Ambulatory Visit: Payer: Self-pay | Admitting: Family Medicine

## 2017-11-17 DIAGNOSIS — R234 Changes in skin texture: Secondary | ICD-10-CM

## 2017-11-17 DIAGNOSIS — N6320 Unspecified lump in the left breast, unspecified quadrant: Secondary | ICD-10-CM

## 2018-02-06 ENCOUNTER — Other Ambulatory Visit: Payer: Self-pay | Admitting: Family Medicine

## 2018-02-06 DIAGNOSIS — E1165 Type 2 diabetes mellitus with hyperglycemia: Secondary | ICD-10-CM

## 2018-02-06 NOTE — Telephone Encounter (Signed)
Called pt on mobile number and a female answered the phone but name was not voiced and states that the pt was not at home. Call was then by the female. Pt will need to call and schedule follow up appt. 30 day refill given until pt schedules follow up appt.

## 2018-03-09 ENCOUNTER — Other Ambulatory Visit: Payer: Self-pay | Admitting: Family Medicine

## 2018-03-09 DIAGNOSIS — E1165 Type 2 diabetes mellitus with hyperglycemia: Secondary | ICD-10-CM

## 2018-04-10 ENCOUNTER — Other Ambulatory Visit: Payer: Self-pay | Admitting: Family Medicine

## 2018-04-10 DIAGNOSIS — E1165 Type 2 diabetes mellitus with hyperglycemia: Secondary | ICD-10-CM

## 2018-04-10 NOTE — Telephone Encounter (Signed)
Refill for metformin 750 mg 24 hr tab LOV  07/06/17  LR 03/09/18 for #30 Last HA1C  on 06/30/17 was 8  Dr. Neva [facility]atGreene [facility] [phone][phone]

## 2018-05-01 ENCOUNTER — Other Ambulatory Visit: Payer: Self-pay | Admitting: Family Medicine

## 2018-05-01 DIAGNOSIS — E1165 Type 2 diabetes mellitus with hyperglycemia: Secondary | ICD-10-CM

## 2018-05-01 NOTE — Telephone Encounter (Signed)
Glucptrol 5 mg  refill Last Refill:03/09/18 # 60 Last OV: 06/30/17 PCP:  Neva SeatGreene [facility] [phone]

## 2018-05-13 ENCOUNTER — Other Ambulatory Visit: Payer: Self-pay | Admitting: Family Medicine

## 2018-05-13 DIAGNOSIS — I10 Essential (primary) hypertension: Secondary | ICD-10-CM

## 2018-05-27 ENCOUNTER — Other Ambulatory Visit: Payer: Self-pay | Admitting: Family Medicine

## 2018-05-27 DIAGNOSIS — E1165 Type 2 diabetes mellitus with hyperglycemia: Secondary | ICD-10-CM

## 2018-06-14 ENCOUNTER — Other Ambulatory Visit: Payer: Self-pay | Admitting: Family Medicine

## 2018-06-14 DIAGNOSIS — I10 Essential (primary) hypertension: Secondary | ICD-10-CM

## 2018-06-15 NOTE — Telephone Encounter (Signed)
Requested Prescriptions  Pending Prescriptions Disp Refills  . valsartan-hydrochlorothiazide (DIOVAN-HCT) 320-12.5 MG tablet [Pharmacy Med Name: VALSARTAN-HCTZ 320-12.5 MG TAB] 90 tablet 0    Sig: TAKE 1 TABLET BY MOUTH EVERY DAY     Cardiovascular: ARB + Diuretic Combos Failed - 06/14/2018  9:33 AM      Failed - K in normal range and within 180 days    Potassium  Date Value Ref Range Status  06/30/2017 4.3 3.5 - 5.2 mmol/L Final         Failed - Na in normal range and within 180 days    Sodium  Date Value Ref Range Status  06/30/2017 141 134 - 144 mmol/L Final         Failed - Cr in normal range and within 180 days    Creat  Date Value Ref Range Status  08/07/2013 0.76 0.50 - 1.10 mg/dL Final   Creatinine, Ser  Date Value Ref Range Status  06/30/2017 0.87 0.57 - 1.00 mg/dL Final         Failed - Ca in normal range and within 180 days    Calcium  Date Value Ref Range Status  06/30/2017 9.5 8.7 - 10.2 mg/dL Final         Failed - Last BP in normal range    BP Readings from Last 1 Encounters:  06/30/17 140/86         Failed - Valid encounter within last 6 months    Recent Outpatient Visits          11 months ago Annual physical exam   [facility], Asencion Partridge, MD   2 years ago Non-intractable vomiting with nausea, vomiting of unspecified type   [facility], Asencion Partridge, MD   4 years ago Cellulitis and abscess of leg   Primary Care at [facility], Raymon Mutton, PA-C   4 years ago Abscess of left groin   [facility], Kenyon Ana, MD   6 years ago Type II or unspecified type diabetes mellitus without mention of complication, not stated as uncontrolled   Primary Care at [facility], Ashley Jacobs, MD      Future Appointments            In 1 week Shade Flood, MD Primary [facility] at [place], [facility]           Passed - Patient is not pregnant    . amLODipine (NORVASC) 5 MG tablet [Pharmacy Med Name: AMLODIPINE BESYLATE 5 MG TAB]  90 tablet 0    Sig: TAKE 1 TABLET (5 MG TOTAL) DAILY BY MOUTH.     Cardiovascular:  Calcium Channel Blockers Failed - 06/14/2018  9:33 AM      Failed - Last BP in normal range    BP Readings from Last 1 Encounters:  06/30/17 140/86         Failed - Valid encounter within last 6 months    Recent Outpatient Visits          11 months ago Annual physical exam   [facility], Asencion Partridge, MD   2 years ago Non-intractable vomiting with nausea, vomiting of unspecified type   [facility], Asencion Partridge, MD   4 years ago Cellulitis and abscess of leg   Primary Care at [facility], PA-C   4 years ago Abscess of left groin   Primary Care at [facility],  MD   6 years ago Type II or unspecified type diabetes mellitus without mention of complication, not stated as uncontrolled   Primary Care at [facility], Ashley Jacobs, MD      Future Appointments            In 1 week Neva Seat Asencion Partridge, MD Primary Care at [place], [facility]

## 2018-06-22 ENCOUNTER — Ambulatory Visit: Payer: Self-pay | Admitting: Family Medicine

## 2018-06-30 ENCOUNTER — Other Ambulatory Visit: Payer: Self-pay | Admitting: Family Medicine

## 2018-06-30 DIAGNOSIS — E1165 Type 2 diabetes mellitus with hyperglycemia: Secondary | ICD-10-CM

## 2018-06-30 NOTE — Telephone Encounter (Signed)
Last office visit 06/30/17; no upcoming appointments noted; attempted to contact pt; unable to leave message on voicemail [phone].   Requested Prescriptions  Pending Prescriptions Disp Refills  . glipiZIDE (GLUCOTROL) 5 MG tablet [Pharmacy Med Name: GLIPIZIDE 5 MG TABLET] 60 tablet 0    Sig: TAKE 1 TABLET (5 MG TOTAL) 2 (TWO) TIMES DAILY BEFORE A MEAL BY MOUTH.     Endocrinology:  Diabetes - Sulfonylureas Failed - 06/30/2018  1:37 AM      Failed - HBA1C is between 0 and 7.9 and within 180 days    Hgb A1c MFr Bld  Date Value Ref Range Status  06/30/2017 8.0 (H) 4.8 - 5.6 % Final    Comment:             Prediabetes: 5.7 - 6.4          Diabetes: >6.4          Glycemic control for adults with diabetes: <7.0          Failed - Valid encounter within last 6 months    Recent Outpatient Visits          1 year ago Annual physical exam   [facility], Asencion PartridgeJeffrey R, MD   2 years ago Non-intractable vomiting with nausea, vomiting of unspecified type   [facility], Asencion PartridgeJeffrey R, MD   4 years ago Cellulitis and abscess of leg   [facility], Raymon Muttonyan M, PA-C   4 years ago Abscess of left groin   [facility], Kenyon AnaKurt, MD   6 years ago Type II or unspecified type diabetes mellitus without mention of complication, not stated as uncontrolled   [facility], Ashley Jacobshris W, MD

## 2018-08-05 ENCOUNTER — Other Ambulatory Visit: Payer: Self-pay | Admitting: Family Medicine

## 2018-08-05 DIAGNOSIS — E1165 Type 2 diabetes mellitus with hyperglycemia: Secondary | ICD-10-CM

## 2018-08-05 NOTE — Telephone Encounter (Signed)
Attempted to reach patient again- voice mail not set up.

## 2018-08-05 NOTE — Telephone Encounter (Signed)
Attempted to call patient- her daughter has her phone- left message for patient to call the office- she will give her the message.

## 2018-08-05 NOTE — Telephone Encounter (Signed)
Unable to reach patient to schedule appointment- request routed to office.

## 2018-09-17 ENCOUNTER — Other Ambulatory Visit: Payer: Self-pay | Admitting: Family Medicine

## 2018-09-17 DIAGNOSIS — E1165 Type 2 diabetes mellitus with hyperglycemia: Secondary | ICD-10-CM

## 2018-09-17 NOTE — Telephone Encounter (Signed)
Requested medication (s) are due for refill today yes  Requested medication (s) are on the active medication list yes  Future visit scheduled no with many outreach attempts to patient to schedule appointment. Routed to PCP for consideration per protocol. TC to patient-no answer.   Requested Prescriptions  Pending Prescriptions Disp Refills   glipiZIDE (GLUCOTROL) 5 MG tablet [Pharmacy Med Name: GLIPIZIDE 5 MG TABLET] 30 tablet 0    Sig: TAKE 1 TABLET (5 MG TOTAL) 2 (TWO) TIMES DAILY BEFORE A MEAL BY MOUTH.     Endocrinology:  Diabetes - Sulfonylureas Failed - 09/17/2018 10:09 AM      Failed - HBA1C is between 0 and 7.9 and within 180 days    Hgb A1c MFr Bld  Date Value Ref Range Status  06/30/2017 8.0 (H) 4.8 - 5.6 % Final    Comment:             Prediabetes: 5.7 - 6.4          Diabetes: >6.4          Glycemic control for adults with diabetes: <7.0          Failed - Valid encounter within last 6 months    Recent Outpatient Visits          1 year ago Annual physical exam   [facility], Asencion Partridge, MD   2 years ago Non-intractable vomiting with nausea, vomiting of unspecified type   [facility], Asencion Partridge, MD   5 years ago Cellulitis and abscess of leg   Primary Care at [facility], Raymon Mutton, PA-C   5 years ago Abscess of left groin   [facility], Kenyon Ana, MD   6 years ago Type II or unspecified type diabetes mellitus without mention of complication, not stated as uncontrolled   Primary Care at [facility], Ashley Jacobs, MD

## 2018-09-17 NOTE — Telephone Encounter (Signed)
LOV 06/30/17.  Outreach to patient many times regarding needing an appointment for additional refills.Routing request to PCP according to protocol. TC to patient-no answer. Requested Prescriptions  Pending Prescriptions Disp Refills  . glipiZIDE (GLUCOTROL) 5 MG tablet [Pharmacy Med Name: GLIPIZIDE 5 MG TABLET] 30 tablet 0    Sig: TAKE 1 TABLET (5 MG TOTAL) 2 (TWO) TIMES DAILY BEFORE A MEAL BY MOUTH.     Endocrinology:  Diabetes - Sulfonylureas Failed - 09/17/2018 10:09 AM      Failed - HBA1C is between 0 and 7.9 and within 180 days    Hgb A1c MFr Bld  Date Value Ref Range Status  06/30/2017 8.0 (H) 4.8 - 5.6 % Final    Comment:             Prediabetes: 5.7 - 6.4          Diabetes: >6.4          Glycemic control for adults with diabetes: <7.0          Failed - Valid encounter within last 6 months    Recent Outpatient Visits          1 year ago Annual physical exam   [facility] at [facility], Asencion Partridge, MD   2 years ago Non-intractable vomiting with nausea, vomiting of unspecified type   [facility] at [facility], Asencion Partridge, MD   5 years ago Cellulitis and abscess of leg   Primary Care at [facility], Raymon Mutton, PA-C   5 years ago Abscess of left groin   [facility], Kenyon Ana, MD   6 years ago Type II or unspecified type diabetes mellitus without mention of complication, not stated as uncontrolled   Primary Care at [facility], Ashley Jacobs, MD

## 2018-10-03 ENCOUNTER — Other Ambulatory Visit: Payer: Self-pay | Admitting: Family Medicine

## 2018-10-03 DIAGNOSIS — I10 Essential (primary) hypertension: Secondary | ICD-10-CM

## 2018-10-04 ENCOUNTER — Other Ambulatory Visit: Payer: Self-pay | Admitting: Family Medicine

## 2018-10-04 DIAGNOSIS — E1165 Type 2 diabetes mellitus with hyperglycemia: Secondary | ICD-10-CM

## 2018-10-05 NOTE — Telephone Encounter (Signed)
Patient called on cell phone listed, no answer and unable to leave VM due to mailbox not set up. Called home number listed, left VM on phone to return call to the office to schedule an office visit with Dr. Neva Seat in order to receive medication refills.

## 2018-10-05 NOTE — Telephone Encounter (Signed)
Attempted to call patient to schedule an appointment for refills. Voicemail not set up.

## 2018-10-05 NOTE — Telephone Encounter (Signed)
Requested medication (s) are due for refill today: Yes  Requested medication (s) are on the active medication list: Yes  Last refill:  09/17/18  Future visit scheduled: No  Notes to clinic:  Unable to refill per protocol, failed items, appointment needed, courtesy refill already given.     Requested Prescriptions  Pending Prescriptions Disp Refills   glipiZIDE (GLUCOTROL) 5 MG tablet [Pharmacy Med Name: GLIPIZIDE 5 MG TABLET] 30 tablet 0    Sig: TAKE 1 TABLET (5 MG TOTAL) 2 (TWO) TIMES DAILY BEFORE A MEAL BY MOUTH.     Endocrinology:  Diabetes - Sulfonylureas Failed - 10/05/2018 11:01 AM      Failed - HBA1C is between 0 and 7.9 and within 180 days    Hgb A1c MFr Bld  Date Value Ref Range Status  06/30/2017 8.0 (H) 4.8 - 5.6 % Final    Comment:             Prediabetes: 5.7 - 6.4          Diabetes: >6.4          Glycemic control for adults with diabetes: <7.0          Failed - Valid encounter within last 6 months    Recent Outpatient Visits          1 year ago Annual physical exam   [facility], Asencion Partridge, MD   2 years ago Non-intractable vomiting with nausea, vomiting of unspecified type   [facility], Asencion Partridge, MD   5 years ago Cellulitis and abscess of leg   Primary Care at [facility], Raymon Mutton, PA-C   5 years ago Abscess of left groin   [facility], Kenyon Ana, MD   6 years ago Type II or unspecified type diabetes mellitus without mention of complication, not stated as uncontrolled   Primary Care at [facility], Ashley Jacobs, MD

## 2018-10-06 NOTE — Telephone Encounter (Signed)
No further refills without office visit 

## 2018-11-01 ENCOUNTER — Other Ambulatory Visit: Payer: Self-pay | Admitting: Family Medicine

## 2018-11-01 DIAGNOSIS — E1165 Type 2 diabetes mellitus with hyperglycemia: Secondary | ICD-10-CM

## 2018-11-02 NOTE — Telephone Encounter (Signed)
Attempted to call patient to schedule an appointment for refills. Call went to recording stating voice mail not set up.

## 2018-11-21 ENCOUNTER — Other Ambulatory Visit: Payer: Self-pay | Admitting: Family Medicine

## 2018-11-21 DIAGNOSIS — E1165 Type 2 diabetes mellitus with hyperglycemia: Secondary | ICD-10-CM

## 2018-11-21 DIAGNOSIS — I10 Essential (primary) hypertension: Secondary | ICD-10-CM

## 2021-07-04 ENCOUNTER — Ambulatory Visit
Admission: EM | Admit: 2021-07-04 | Discharge: 2021-07-04 | Disposition: A | Discharge status: Home / Self Care | Payer: No Typology Code available for payment source | Attending: Physician Assistant | Admitting: Physician Assistant

## 2021-07-04 ENCOUNTER — Other Ambulatory Visit: Payer: Self-pay

## 2021-07-04 DIAGNOSIS — L0291 Cutaneous abscess, unspecified: Secondary | ICD-10-CM | POA: Diagnosis not present

## 2021-07-04 MED ORDER — DOXYCYCLINE HYCLATE 100 MG PO CAPS: 100.0000 mg | ORAL_CAPSULE | Freq: Two times a day (BID) | ORAL | 0 refills | Status: DC

## 2021-07-04 NOTE — ED Provider Notes (Signed)
Holly Spencer    CSN: [id number] Arrival date & time: 07/04/21  1848      History   Chief Complaint Chief Complaint  Patient presents with   Abscess    HPI Holly Spencer is a 60 y.o. female.   Here today for evaluation of an abscess to her center chest between her breasts.  She states she was seen by her PCP and had culture but was not treated at that time.  Her PCP did mention option for incision and drainage.  She does have low-grade fever in office today but has not had fever otherwise.  She states that the abscess seems to be growing in size and is very uncomfortable. She has been using warm compresses as advised without relief.  The history is provided by the patient.   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      Home Medications    Prior to Admission medications   Medication Sig Start Date End Date Taking? Authorizing Provider  doxycycline (VIBRAMYCIN) 100 MG capsule Take 1 capsule (100 mg total) by mouth 2 (two) times daily. 07/04/21  Yes Francene Finders, PA-C  amLODipine (NORVASC) 5 MG tablet TAKE 1 TABLET (5 MG TOTAL) DAILY BY MOUTH. 06/15/18   Wendie Agreste, MD  Blood Glucose Monitoring Suppl (BLOOD GLUCOSE METER) kit Use as instructed 05/25/12   Gale Journey, Damaris Hippo, PA-C  FINGERSTIX LANCETS MISC To check blood sugar tid/ 250.00 dx code uncontrolled diabetes 08/07/13   Robyn Haber, MD  glipiZIDE (GLUCOTROL) 5 MG tablet TAKE 1  TABLET (5 MG TOTAL) 2 (TWO) TIMES DAILY BEFORE A MEAL BY MOUTH. 10/06/18   Wendie Agreste, MD  Homeopathic Products [facility] ALLERGY RELIEF NA) Place 1 spray into the nose 2 (two) times daily as needed (allergies).    [provider]  metFORMIN (GLUCOPHAGE-XR) 750 MG 24 hr tablet TAKE 1 TABLET (750 MG TOTAL) DAILY BY MOUTH. 03/09/18   Wendie Agreste, MD  ondansetron (ZOFRAN ODT) 4 MG disintegrating tablet Take 1 tablet (4 mg total) by mouth every 8 (eight) hours as needed for nausea. 03/04/16   Noemi Chapel, MD  valsartan-hydrochlorothiazide (DIOVAN-HCT) 320-12.5 MG tablet TAKE 1 TABLET BY MOUTH EVERY DAY 06/15/18   Wendie Agreste, MD    Family History Family History  Problem Relation Age of Onset   Diabetes type I Father        Deceased   Asthma Mother        Living, 96   Diabetes Mellitus II Mother    Diabetes Mellitus II Sister    Hypertension Sister    Healthy Daughter    Healthy Son     Social History Social History   Tobacco Use   Smoking status: Never   Smokeless tobacco: Never  Substance Use Topics   Alcohol use: No    Alcohol/week: 0.0 standard drinks   Drug use: No     Allergies  Patient has no known allergies.   Review of Systems Review of Systems  Constitutional:  Positive for fever. Negative for chills.  Eyes:  Negative for discharge and redness.  Respiratory:  Negative for shortness of breath.   Gastrointestinal:  Negative for abdominal pain, nausea and vomiting.  Genitourinary:  Positive for vaginal bleeding and vaginal discharge.  Skin:  Positive for color change. Negative for wound.    Physical Exam Triage Vital Signs ED Triage Vitals  Enc Vitals Group     BP      Pulse      Resp      Temp      Temp src      SpO2      Weight      Height      Head Circumference      Peak Flow      Pain Score      Pain Loc      Pain Edu?      Excl. in [place]?    No data found.  Updated Vital Signs BP (!) 177/80   Pulse 84   Temp (!)  100.5 F (38.1 C) (Oral)   Resp 18   SpO2 97%      Physical Exam Vitals and nursing note reviewed.  Constitutional:      General: She is not in acute distress.    Appearance: Normal appearance. She is not ill-appearing.  HENT:     Head: Normocephalic and atraumatic.  Eyes:     Conjunctiva/sclera: Conjunctivae normal.  Cardiovascular:     Rate and Rhythm: Normal rate.  Pulmonary:     Effort: Pulmonary effort is normal.  Skin:    Comments: Irregularly shaped area of induration and erythema to sternal area between breasts- ? Multiple abscesses  Neurological:     Mental Status: She is alert.  Psychiatric:        Mood and Affect: Mood normal.        Behavior: Behavior normal.        Thought Content: Thought content normal.     UC Treatments / Results  Labs (all labs ordered are listed, but only abnormal results are displayed) Labs Reviewed - No data to display  EKG   Radiology No results found.  Procedures Procedures (including critical Spencer time)  Medications Ordered in UC Medications - No data to display  Initial Impression / Assessment and Plan / UC Course  I have reviewed the triage vital signs and the nursing notes.  Pertinent labs & imaging results that were available during my Spencer of the patient were reviewed by me and considered in my medical decision making (see chart for details).   Given appearance and location of abscess recommended oral antibiotic therapy and follow up with her PCP. I did not feel incision and drainage was appropriate at this time. Encouraged follow up with any further concerns.   Final Clinical Impressions(s) / UC Diagnoses   Final diagnoses:  Abscess   Discharge Instructions   None    ED Prescriptions     Medication Sig Dispense Auth. Provider   doxycycline (VIBRAMYCIN) 100 MG capsule Take 1 capsule (100 mg total) by mouth 2 (two) times daily. 20 capsule Francene Finders, PA-C      PDMP not reviewed this encounter.    Francene Finders, PA-C 07/05/21 [phone]

## 2021-07-04 NOTE — ED Triage Notes (Signed)
Pt c/o abscess between breast for 3wks. States saw her PCP last week and using hot compresses, worsen in the last week.

## 2021-07-05 ENCOUNTER — Encounter (HOSPITAL_COMMUNITY): Payer: Self-pay | Admitting: Emergency Medicine

## 2021-07-05 ENCOUNTER — Other Ambulatory Visit: Payer: Self-pay

## 2021-07-05 ENCOUNTER — Emergency Department (HOSPITAL_COMMUNITY)
Admission: EM | Admit: 2021-07-05 | Discharge: 2021-07-06 | Disposition: A | Discharge status: Home / Self Care | Payer: No Typology Code available for payment source | Attending: Emergency Medicine | Admitting: Emergency Medicine

## 2021-07-05 ENCOUNTER — Encounter: Payer: Self-pay | Admitting: Physician Assistant

## 2021-07-05 DIAGNOSIS — Z7984 Long term (current) use of oral hypoglycemic drugs: Secondary | ICD-10-CM | POA: Diagnosis not present

## 2021-07-05 DIAGNOSIS — E119 Type 2 diabetes mellitus without complications: Secondary | ICD-10-CM | POA: Diagnosis not present

## 2021-07-05 DIAGNOSIS — N611 Abscess of the breast and nipple: Secondary | ICD-10-CM | POA: Insufficient documentation

## 2021-07-05 DIAGNOSIS — R0789 Other chest pain: Secondary | ICD-10-CM | POA: Insufficient documentation

## 2021-07-05 DIAGNOSIS — I10 Essential (primary) hypertension: Secondary | ICD-10-CM | POA: Insufficient documentation

## 2021-07-05 DIAGNOSIS — L0291 Cutaneous abscess, unspecified: Secondary | ICD-10-CM

## 2021-07-05 DIAGNOSIS — Z79899 Other long term (current) drug therapy: Secondary | ICD-10-CM | POA: Diagnosis not present

## 2021-07-05 LAB — CBC WITH DIFFERENTIAL/PLATELET
Abs Immature Granulocytes: 0.06 10*3/uL (ref 0.00–0.07)
Basophils Absolute: 0.1 10*3/uL (ref 0.0–0.1)
Basophils Relative: 0 %
Eosinophils Absolute: 0.1 10*3/uL (ref 0.0–0.5)
Eosinophils Relative: 1 %
HCT: 39.5 % (ref 36.0–46.0)
Hemoglobin: 13 g/dL (ref 12.0–15.0)
Immature Granulocytes: 1 %
Lymphocytes Relative: 19 %
Lymphs Abs: 2.3 10*3/uL (ref 0.7–4.0)
MCH: 31.1 pg (ref 26.0–34.0)
MCHC: 32.9 g/dL (ref 30.0–36.0)
MCV: 94.5 fL (ref 80.0–100.0)
Monocytes Absolute: 1 10*3/uL (ref 0.1–1.0)
Monocytes Relative: 9 %
Neutro Abs: 8.6 10*3/uL — ABNORMAL HIGH (ref 1.7–7.7)
Neutrophils Relative %: 70 %
Platelets: 315 10*3/uL (ref 150–400)
RBC: 4.18 MIL/uL (ref 3.87–5.11)
RDW: 12.7 % (ref 11.5–15.5)
WBC: 12.1 10*3/uL — ABNORMAL HIGH (ref 4.0–10.5)
nRBC: 0 % (ref 0.0–0.2)

## 2021-07-05 LAB — BASIC METABOLIC PANEL
Anion gap: 9 (ref 5–15)
BUN: 16 mg/dL (ref 6–20)
CO2: 28 mmol/L (ref 22–32)
Calcium: 9.2 mg/dL (ref 8.9–10.3)
Chloride: 99 mmol/L (ref 98–111)
Creatinine, Ser: 1.09 mg/dL — ABNORMAL HIGH (ref 0.44–1.00)
GFR, Estimated: 59 mL/min — ABNORMAL LOW (ref >60)
Glucose, Bld: 314 mg/dL — ABNORMAL HIGH (ref 70–99)
Potassium: 3.8 mmol/L (ref 3.5–5.1)
Sodium: 136 mmol/L (ref 135–145)

## 2021-07-05 NOTE — ED Provider Notes (Signed)
Emergency Medicine Provider Triage Evaluation Note  KLOE OATES , a 60 y.o. female  was evaluated in triage.  Pt complains of abscess that has been present for over 1 week. Abscess located between the breasts. Had culture completed by pcp and was seen at [facility] yesterday. Yes[facility] at [facility] had a temp of 100.5. she was started on doxy at that time but I&D was not performed.  Review of Systems  Positive: abscess Negative: nv  Physical Exam  BP (!) 151/130 (BP Location: Left Arm)   Pulse 93   Temp 98.9 F (37.2 C)   Resp 16   Ht 5\' 5"  (1.651 m)   Wt 95 kg   SpO2 98%   BMI 34.85 kg/m  Gen:   Awake, no distress   Resp:  Normal effort  MSK:   Moves extremities without difficulty  Other:  Erythema, induration and warmth noted between the breasts and extending along the lower inner quadrant of the right breast  Medical Decision Making  Medically screening exam initiated at 8:33 PM.  Appropriate orders placed.  Ariyannah Pauling Daffron was informed that the remainder of the evaluation will be completed by another provider, this initial triage assessment does not replace that evaluation, and the importance of remaining in the ED until their evaluation is complete.     Rosezella Florida 07/05/21 2036    2037, MD 07/08/21 [phone]

## 2021-07-05 NOTE — ED Triage Notes (Signed)
Patient reports worsening skin abscess at sternum for 3 weeks , currently taking oral antibiotic with no improvement .

## 2021-07-06 MED ORDER — LIDOCAINE-EPINEPHRINE (PF) 2 %-1:200000 IJ SOLN
10.0000 mL | Freq: Once | INTRAMUSCULAR | Status: AC
  Administered 2021-07-06: 10 mL
  Filled 2021-07-06: qty 20

## 2021-07-06 MED ORDER — DOXYCYCLINE HYCLATE 100 MG PO CAPS: 100.0000 mg | ORAL_CAPSULE | Freq: Two times a day (BID) | ORAL | 0 refills | Status: DC

## 2021-07-06 NOTE — Discharge Instructions (Signed)
You were seen in the emergency department for evaluation of an abscess on your chest.  This area was incised and drained and a packing was placed.  A wound culture was sent.  We are starting you back on antibiotics.  Please remove the packing in 2 days.  Return if any worsening or concerning symptoms.

## 2021-07-06 NOTE — ED Provider Notes (Signed)
[facility] EMERGENCY DEPARTMENT Provider Note   CSN: [id number] Arrival date & time: 07/05/21  1944     History Chief Complaint  Patient presents with   Abscess    Holly Spencer is a 60 y.o. female.  She is here with a painful swollen area between her breasts and into her right breast that has been progressive over a week.  She is tried warm compresses and is on doxycycline by her PCP without any improvement.  No other systemic symptoms.  1 prior abscess on her backside.  The history is provided by the patient.  Abscess Location:  Torso Torso abscess location:  R breast and R chest Size:  5 Abscess quality: fluctuance, induration, painful, redness and warmth   Red streaking: no   Duration:  1 week Progression:  Worsening Pain details:    Quality:  Dull   Severity:  Moderate   Timing:  Constant   Progression:  Worsening Chronicity:  New Context: diabetes   Relieved by:  Nothing Worsened by:  Nothing Ineffective treatments:  Warm compresses and oral antibiotics Associated symptoms: no anorexia, no fatigue, no fever, no headaches, no nausea and no vomiting   Risk factors: prior abscess       Past Medical History:  Diagnosis Date   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     Family History  Problem Relation Age of Onset   Diabetes type I Father        Deceased   Asthma Mother        Living, 29   Diabetes Mellitus II Mother    Diabetes Mellitus II Sister     Hypertension Sister    Healthy Daughter    Healthy Son     Social History   Tobacco Use   Smoking status: Never   Smokeless tobacco: Never  Substance Use Topics   Alcohol use: No    Alcohol/week: 0.0 standard drinks   Drug use: No    Home Medications Prior to Admission medications   Medication Sig Start Date End Date Taking? Authorizing Provider  amLODipine (NORVASC) 5 MG tablet TAKE 1 TABLET (5 MG TOTAL) DAILY BY MOUTH. 06/15/18   Wendie Agreste, MD  Blood Glucose Monitoring Suppl (BLOOD GLUCOSE METER) kit Use as instructed 05/25/12   Gale Journey, Damaris Hippo, PA-C  doxycycline (VIBRAMYCIN) 100 MG capsule Take 1 capsule (100 mg total) by mouth 2 (two) times daily. 07/04/21   Francene Finders, PA-C  FINGERSTIX LANCETS MISC To check blood sugar tid/ 250.00 dx code uncontrolled diabetes 08/07/13   Robyn Haber, MD  glipiZIDE (GLUCOTROL) 5 MG tablet TAKE 1 TABLET (5 MG TOTAL) 2 (TWO) TIMES DAILY BEFORE A MEAL BY MOUTH. 10/06/18   Wendie Agreste, MD  Homeopathic Products [facility] ALLERGY RELIEF NA) Place 1 spray into the nose 2 (two) times daily as needed (allergies).    [provider]  metFORMIN (GLUCOPHAGE-XR) 750 MG 24 hr tablet TAKE 1 TABLET (750  MG TOTAL) DAILY BY MOUTH. 03/09/18   Wendie Agreste, MD  ondansetron (ZOFRAN ODT) 4 MG disintegrating tablet Take 1 tablet (4 mg total) by mouth every 8 (eight) hours as needed for nausea. 03/04/16   Noemi Chapel, MD  valsartan-hydrochlorothiazide (DIOVAN-HCT) 320-12.5 MG tablet TAKE 1 TABLET BY MOUTH EVERY DAY 06/15/18   Wendie Agreste, MD    Allergies    Patient has no known allergies.  Review of Systems   Review of Systems  Constitutional:  Negative for fatigue and fever.  HENT:  Negative for sore throat.   Eyes:  Negative for visual disturbance.  Respiratory:  Negative for shortness of breath.   Cardiovascular:  Negative for chest pain.  Gastrointestinal:  Negative for abdominal pain, anorexia, nausea and vomiting.   Genitourinary:  Negative for dysuria.  Musculoskeletal:  Negative for neck pain.  Skin:  Negative for rash.  Neurological:  Negative for headaches.   Physical Exam Updated Vital Signs BP (!) 175/86 (BP Location: Left Arm)   Pulse 75   Temp 98.9 F (37.2 C)   Resp 15   Ht _0  (1.651 m)   Wt 95 kg   SpO2 95%   BMI 34.85 kg/m   Physical Exam Vitals and nursing note reviewed.  Constitutional:      General: She is not in acute distress.    Appearance: Normal appearance. She is well-developed.  HENT:     Head: Normocephalic and atraumatic.  Eyes:     Conjunctiva/sclera: Conjunctivae normal.  Cardiovascular:     Rate and Rhythm: Normal rate and regular rhythm.     Heart sounds: No murmur heard. Pulmonary:     Effort: Pulmonary effort is normal. No respiratory distress.     Breath sounds: Normal breath sounds.  Chest:     Chest wall: Tenderness present.    Abdominal:     Palpations: Abdomen is soft.     Tenderness: There is no abdominal tenderness.  Musculoskeletal:        General: No swelling.     Cervical back: Neck supple.  Skin:    General: Skin is warm and dry.     Capillary Refill: Capillary refill takes less than 2 seconds.  Neurological:     Mental Status: She is alert.  Psychiatric:        Mood and Affect: Mood normal.    ED Results / Procedures / Treatments   Labs (all labs ordered are listed, but only abnormal results are displayed) Labs Reviewed  CBC WITH DIFFERENTIAL/PLATELET - Abnormal; Notable for the following components:      Result Value   WBC 12.1 (*)    Neutro Abs 8.6 (*)    All other components within normal limits  BASIC METABOLIC PANEL - Abnormal; Notable for the following components:   Glucose, Bld 314 (*)    Creatinine, Ser 1.09 (*)    GFR, Estimated 59 (*)    All other components within normal limits    EKG None  Radiology No results found.  Procedures .Marland KitchenIncision and Drainage  Date/Time: 07/06/2021 9:28 AM Performed  by: Hayden Rasmussen, MD Authorized by: Hayden Rasmussen, MD   Consent:    Consent obtained:  Verbal   Consent given by:  Patient   Risks, benefits, and alternatives were discussed: yes     Risks discussed:  Incomplete drainage, pain, infection and bleeding Universal protocol:    Procedure explained and questions answered to patient or proxy's satisfaction: yes  Patient identity confirmed:  Verbally with patient Location:    Type:  Abscess   Size:  5   Location:  Trunk   Trunk location:  R breast Pre-procedure details:    Skin preparation:  Povidone-iodine Sedation:    Sedation type:  None Anesthesia:    Anesthesia method:  Local infiltration   Local anesthetic:  Lidocaine 2% WITH epi Procedure type:    Complexity:  Complex Procedure details:    Incision depth:  Submucosal   Wound management:  Probed and deloculated   Drainage:  Purulent   Drainage amount:  Copious   Wound treatment:  Wound left open   Packing materials:  1/4 in gauze   Amount 1/4":  10   Medications Ordered in ED Medications - No data to display  ED Course  I have reviewed the triage vital signs and the nursing notes.  Pertinent labs & imaging results that were available during my care of the patient were reviewed by me and considered in my medical decision making (see chart for details).    MDM Rules/Calculators/A&P                          [place] diagnosis includes cellulitis, abscess, tumor.  Area prepped and I indeed with copious amount of pus expressed.  Gauze placed in wound.  We will extend her antibiotics that she is currently on.  Wound culture sent.  Return instructions discussed.  Final Clinical Impression(s) / ED Diagnoses Final diagnoses:  Abscess    Rx / DC Orders ED Discharge Orders          Ordered    doxycycline (VIBRAMYCIN) 100 MG capsule  2 times daily        07/06/21 0929             Hayden Rasmussen, MD 07/06/21 [phone]

## 2021-07-09 LAB — AEROBIC CULTURE W GRAM STAIN (SUPERFICIAL SPECIMEN): Special Requests: NORMAL

## 2021-12-13 ENCOUNTER — Other Ambulatory Visit: Payer: Self-pay | Admitting: Family Medicine

## 2021-12-13 ENCOUNTER — Other Ambulatory Visit: Payer: Self-pay | Admitting: Obstetrics and Gynecology

## 2021-12-13 DIAGNOSIS — Z1231 Encounter for screening mammogram for malignant neoplasm of breast: Secondary | ICD-10-CM

## 2022-01-10 ENCOUNTER — Ambulatory Visit
Admission: RE | Admit: 2022-01-10 | Discharge: 2022-01-10 | Disposition: A | Discharge status: Home / Self Care | Payer: No Typology Code available for payment source | Source: Ambulatory Visit | Attending: Family Medicine | Admitting: Family Medicine

## 2022-01-10 DIAGNOSIS — Z1231 Encounter for screening mammogram for malignant neoplasm of breast: Secondary | ICD-10-CM

## 2022-12-17 ENCOUNTER — Ambulatory Visit
Admission: EM | Admit: 2022-12-17 | Discharge: 2022-12-17 | Disposition: A | Discharge status: Home / Self Care | Payer: No Typology Code available for payment source

## 2022-12-17 DIAGNOSIS — R112 Nausea with vomiting, unspecified: Secondary | ICD-10-CM

## 2022-12-17 DIAGNOSIS — A059 Bacterial foodborne intoxication, unspecified: Secondary | ICD-10-CM

## 2022-12-17 DIAGNOSIS — R197 Diarrhea, unspecified: Secondary | ICD-10-CM

## 2022-12-17 MED ORDER — ONDANSETRON 8 MG PO TBDP
8.0000 mg | ORAL_TABLET | Freq: Once | ORAL | Status: AC
  Administered 2022-12-17: 8 mg via ORAL

## 2022-12-17 MED ORDER — ONDANSETRON 8 MG PO TBDP: 8.0000 mg | ORAL_TABLET | Freq: Three times a day (TID) | ORAL | 0 refills | Status: AC | PRN

## 2022-12-17 NOTE — ED Provider Notes (Signed)
[facility]    CSN: [id number] Arrival date & time: 12/17/22  1003      History   Chief Complaint Chief Complaint  Patient presents with   Emesis    HPI Holly Spencer is a 63 y.o. female.   HPI  She is in today complaining of nausea, vomiting, bloating and diarrhea Since this weekend.  She reports that she has been to a funeral and feels that she may have been having some bad food.  She denies any fever.  She has a chills and bodyaches.  She reports that no one else is sick.  She feels like the food tasted bad but she continued to eat and just added A1 sauce.  She has taken Pedialyte, Mylanta and Nexium at home with no relief. 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      Home Medications    Prior to Admission medications   Medication Sig Start Date End Date Taking? Authorizing Provider  JARDIANCE 10 MG TABS tablet Take 1 tablet by mouth daily. 07/23/22  Yes [provider]  ondansetron (ZOFRAN-ODT) 8 MG disintegrating tablet Take 1 tablet (8 mg total) by mouth every 8 (eight) hours as needed for up to 20 days for nausea or vomiting. 12/17/22 01/06/23 Yes Barbette Merino, NP  amLODipine (NORVASC) 5 MG tablet TAKE 1 TABLET (5 MG TOTAL) DAILY BY MOUTH. 06/15/18   Shade Flood, MD  Blood Glucose Monitoring Suppl (BLOOD GLUCOSE METER) kit Use as instructed 05/25/12   Valarie Cones, Dema Severin, PA-C  FINGERSTIX LANCETS MISC To check blood sugar tid/  250.00 dx code uncontrolled diabetes 08/07/13   Elvina Sidle, MD  glipiZIDE (GLUCOTROL) 5 MG tablet TAKE 1 TABLET (5 MG TOTAL) 2 (TWO) TIMES DAILY BEFORE A MEAL BY MOUTH. 10/06/18   Shade Flood, MD  Homeopathic Products [facility] ALLERGY RELIEF NA) Place 1 spray into the nose 2 (two) times daily as needed (allergies).    [provider]  metFORMIN (GLUCOPHAGE-XR) 750 MG 24 hr tablet TAKE 1 TABLET (750 MG TOTAL) DAILY BY MOUTH. 03/09/18   Shade Flood, MD  valsartan-hydrochlorothiazide (DIOVAN-HCT) 320-12.5 MG tablet TAKE 1 TABLET BY MOUTH EVERY DAY 06/15/18   Shade Flood, MD    Family History Family History  Problem Relation Age of Onset   Diabetes type I Father        Deceased   Asthma Mother        Living, 26   Diabetes Mellitus II Mother    Diabetes Mellitus II Sister    Hypertension Sister    Healthy Daughter    Healthy Son     Social History Social History   Tobacco Use   Smoking status: Never   Smokeless tobacco: Never  Substance Use Topics   Alcohol use: No    Alcohol/week: 0.0 standard drinks of alcohol   Drug use: No     Allergies   Patient  has no known allergies.   Review of Systems Review of Systems   Physical Exam Triage Vital Signs ED Triage Vitals  Enc Vitals Group     BP 12/17/22 1035 (!) 144/96     Pulse Rate 12/17/22 1035 67     Resp 12/17/22 1035 16     Temp 12/17/22 1035 98.3 F (36.8 C)     Temp Source 12/17/22 1035 Oral     SpO2 12/17/22 1035 98 %     Weight --      Height --      Head Circumference --      Peak Flow --      Pain Score 12/17/22 1037 3     Pain Loc --      Pain Edu? --      Excl. in GC? --    No data found.  Updated Vital Signs BP (!) 144/96 (BP Location: Left Arm)   Pulse 67   Temp 98.3 F (36.8 C) (Oral)   Resp 16   SpO2 98%   Visual Acuity Right Eye Distance:   Left Eye Distance:   Bilateral Distance:    Right Eye Near:   Left Eye Near:    Bilateral Near:     Physical  Exam Constitutional:      Appearance: She is obese.  HENT:     Head: Normocephalic.  Cardiovascular:     Rate and Rhythm: Normal rate.     Pulses: Normal pulses.  Pulmonary:     Effort: Pulmonary effort is normal.     Breath sounds: Normal breath sounds.  Abdominal:     Comments: Increased abdominal girth and hypoactive  Neurological:     Mental Status: She is alert.      UC Treatments / Results  Labs (all labs ordered are listed, but only abnormal results are displayed) Labs Reviewed - No data to display  EKG   Radiology No results found.  Procedures Procedures (including critical care time)  Medications Ordered in UC Medications  ondansetron (ZOFRAN-ODT) disintegrating tablet 8 mg (8 mg Oral Given 12/17/22 1118)    Initial Impression / Assessment and Plan / UC Course  I have reviewed the triage vital signs and the nursing notes.  Pertinent labs & imaging results that were available during my care of the patient were reviewed by me and considered in my medical decision making (see chart for details).     Nausea vomiting diarrhea Final Clinical Impressions(s) / UC Diagnoses   Final diagnoses:  Nausea vomiting and diarrhea  Food poisoning  Patient declined lab testing   Discharge Instructions      You have been given ondansetron 8 mg for nausea.  You have also been considered home with a prescription for the Zofran you can take this up to 3 times daily as needed for nausea.  Some cases of food poisoning can last from 2 to 7 days.  Again continue to hydrate well with Pedialyte or Gatorade.  You can slowly start introducing food into your diet start with bananas rice applesauce toast crackers.  For the diarrhea you can use Imodium over-the-counter as directed please do not exceed the directions because this may cause other complications.  If your symptoms do not improve over the next 2 days the recommendation would be to follow-up with your primary care for  additional testing.     ED Prescriptions     Medication Sig Dispense Auth. Provider   ondansetron (ZOFRAN-ODT) 8 MG  disintegrating tablet Take 1 tablet (8 mg total) by mouth every 8 (eight) hours as needed for up to 20 days for nausea or vomiting. 60 tablet Barbette Merino, NP      PDMP not reviewed this encounter.   Thad Ranger [place], [place] 12/17/22 1144

## 2022-12-17 NOTE — ED Triage Notes (Addendum)
Pt states N/V/D and abdominal cramping for the past 4 days.  States she thinks she ate something bad.  Has been taking Pedialyte,Mylanta and Nexium  at home with no relief.

## 2022-12-17 NOTE — Discharge Instructions (Addendum)
You have been given ondansetron 8 mg for nausea.  You have also been considered home with a prescription for the Zofran you can take this up to 3 times daily as needed for nausea.  Some cases of food poisoning can last from 2 to 7 days.  Again continue to hydrate well with Pedialyte or Gatorade.  You can slowly start introducing food into your diet start with bananas rice applesauce toast crackers.  For the diarrhea you can use Imodium over-the-counter as directed please do not exceed the directions because this may cause other complications.  If your symptoms do not improve over the next 2 days the recommendation would be to follow-up with your primary care for additional testing.

## 2023-08-06 ENCOUNTER — Ambulatory Visit
Admission: EM | Admit: 2023-08-06 | Discharge: 2023-08-06 | Disposition: A | Discharge status: Home / Self Care | Payer: No Typology Code available for payment source | Attending: Internal Medicine | Admitting: Internal Medicine

## 2023-08-06 DIAGNOSIS — L03115 Cellulitis of right lower limb: Secondary | ICD-10-CM | POA: Diagnosis not present

## 2023-08-06 MED ORDER — DOXYCYCLINE MONOHYDRATE 100 MG PO CAPS: 100.0000 mg | ORAL_CAPSULE | Freq: Two times a day (BID) | ORAL | 0 refills | Status: DC

## 2023-08-06 MED ORDER — DOXYCYCLINE MONOHYDRATE 100 MG PO CAPS: 100.0000 mg | ORAL_CAPSULE | Freq: Two times a day (BID) | ORAL | 0 refills | Status: AC

## 2023-08-06 MED ORDER — MUPIROCIN 2 % EX OINT: 1.0000 | TOPICAL_OINTMENT | Freq: Three times a day (TID) | CUTANEOUS | 0 refills | Status: AC

## 2023-08-06 NOTE — Discharge Instructions (Signed)
Keep leg elevated, apply warm compresses, recheck in 48 hours, go to the emergency department for worsening of symptoms including increased redness, increased pain, increased swelling, development of fever or flulike symptoms.

## 2023-08-06 NOTE — ED Triage Notes (Signed)
Patient presents with right leg pain, soreness, tightness around area and redness. Patient states she have been treating this area since 10/31 with Neosporin, triple antibiotics ointment and keeping the area clean without it healing.

## 2023-08-06 NOTE — ED Provider Notes (Signed)
[facility]    CSN: [id number] Arrival date & time: 08/06/23  1326      History   Chief Complaint No chief complaint on file.   HPI Holly Spencer is a 62 y.o. female.   HPI  Has a wound on her right shin instead of October of this year.  States wound is not healing, seems to be getting larger now has local redness which has worsened, local swelling and tenderness.  She is a non-insulin-dependent diabetic blood sugars have been running around 170.  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      Home Medications    Prior to Admission medications   Medication Sig Start Date End Date Taking? Authorizing Provider  amLODipine (NORVASC) 5 MG tablet TAKE 1 TABLET (5 MG TOTAL) DAILY BY MOUTH. 06/15/18   Shade Flood, MD  Blood Glucose Monitoring Suppl (BLOOD GLUCOSE METER) kit Use as instructed 05/25/12   Valarie Cones, Dema Severin, PA-C  FINGERSTIX LANCETS MISC To check blood sugar tid/ 250.00 dx code uncontrolled diabetes 08/07/13   Elvina Sidle, MD  glipiZIDE (GLUCOTROL) 5 MG tablet TAKE 1 TABLET (5 MG TOTAL) 2 (TWO) TIMES DAILY BEFORE A MEAL BY MOUTH. 10/06/18   Shade Flood, MD  Homeopathic Products [facility] ALLERGY RELIEF NA) Place 1 spray into the nose 2 (two) times daily as needed (allergies).    [provider]  JARDIANCE 10 MG TABS tablet Take 1 tablet by mouth daily. 07/23/22   [provider]   metFORMIN (GLUCOPHAGE-XR) 750 MG 24 hr tablet TAKE 1 TABLET (750 MG TOTAL) DAILY BY MOUTH. 03/09/18   Shade Flood, MD  valsartan-hydrochlorothiazide (DIOVAN-HCT) 320-12.5 MG tablet TAKE 1 TABLET BY MOUTH EVERY DAY 06/15/18   Shade Flood, MD    Family History Family History  Problem Relation Age of Onset   Diabetes type I Father        Deceased   Asthma Mother        Living, 26   Diabetes Mellitus II Mother    Diabetes Mellitus II Sister    Hypertension Sister    Healthy Daughter    Healthy Son     Social History Social History   Tobacco Use   Smoking status: Never   Smokeless tobacco: Never  Substance Use Topics   Alcohol use: No    Alcohol/week: 0.0 standard drinks of alcohol   Drug use: No     Allergies   Patient has no known allergies.   Review of Systems Review of Systems  Constitutional:  Negative for chills, diaphoresis, fatigue and fever.  Skin:  Positive for color change and wound.     Physical Exam Triage Vital Signs ED Triage Vitals  Encounter Vitals Group     BP 08/06/23 1410 (!) 148/77     Systolic BP Percentile --      Diastolic BP Percentile --  Pulse Rate 08/06/23 1410 81     Resp 08/06/23 1410 16     Temp 08/06/23 1410 98.4 F (36.9 C)     Temp Source 08/06/23 1410 Oral     SpO2 08/06/23 1410 99 %     Weight 08/06/23 1408 198 lb (89.8 kg)     Height 08/06/23 1408 5\' 4"  (1.626 m)     Head Circumference --      Peak Flow --      Pain Score 08/06/23 1408 3     Pain Loc --      Pain Education --      Exclude from Growth Chart --    No data found.  Updated Vital Signs BP (!) 148/77 (BP Location: Left Arm)   Pulse 81   Temp 98.4 F (36.9 C) (Oral)   Resp 16   Ht 5\' 4"  (1.626 m)   Wt 198 lb (89.8 kg)   SpO2 99%   BMI 33.99 kg/m   Visual Acuity Right Eye Distance:   Left Eye Distance:   Bilateral Distance:    Right Eye Near:   Left Eye Near:    Bilateral Near:     Physical Exam Vitals reviewed.   Constitutional:      Appearance: Normal appearance.  Eyes:     Extraocular Movements: Extraocular movements intact.     Conjunctiva/sclera: Conjunctivae normal.  Skin:    Comments: Anterior right lower leg has 1 cm scabbed lesion with surrounding erythema and warmth extending 3 cm from wound edges, faint erythema spreading distally, local edema.  No calf tenderness, no active drainage, area is nonfluctuant no foreign body seen or palpated  Neurological:     Mental Status: She is alert and oriented to person, place, and time.  Psychiatric:        Mood and Affect: Mood normal.      UC Treatments / Results  Labs (all labs ordered are listed, but only abnormal results are displayed) Labs Reviewed - No data to display  EKG   Radiology No results found.  Procedures Procedures (including critical care time)  Medications Ordered in UC Medications - No data to display  Initial Impression / Assessment and Plan / UC Course  I have reviewed the triage vital signs and the nursing notes.  Pertinent labs & imaging results that were available during my care of the patient were reviewed by me and considered in my medical decision making (see chart for details).     62 year old female with nonhealing wound right lower extremity, now has cellulitis.  Will treat with antibiotics oral and topical, recommend elevation, warm compresses, recheck in 48 hours, strict ED precautions reviewed with patient Final Clinical Impressions(s) / UC Diagnoses   Final diagnoses:  None   Discharge Instructions   None    ED Prescriptions   None    PDMP not reviewed this encounter.   Meliton Rattan, [place] 08/06/23 1439

## 2023-08-12 ENCOUNTER — Other Ambulatory Visit: Payer: Self-pay

## 2023-08-12 ENCOUNTER — Emergency Department (HOSPITAL_COMMUNITY)
Admission: EM | Admit: 2023-08-12 | Discharge: 2023-08-12 | Disposition: A | Discharge status: Home / Self Care | Payer: No Typology Code available for payment source | Attending: Emergency Medicine | Admitting: Emergency Medicine

## 2023-08-12 ENCOUNTER — Encounter (HOSPITAL_COMMUNITY): Payer: Self-pay

## 2023-08-12 DIAGNOSIS — Z79899 Other long term (current) drug therapy: Secondary | ICD-10-CM | POA: Insufficient documentation

## 2023-08-12 DIAGNOSIS — L03115 Cellulitis of right lower limb: Secondary | ICD-10-CM | POA: Insufficient documentation

## 2023-08-12 DIAGNOSIS — Z7984 Long term (current) use of oral hypoglycemic drugs: Secondary | ICD-10-CM | POA: Insufficient documentation

## 2023-08-12 DIAGNOSIS — E119 Type 2 diabetes mellitus without complications: Secondary | ICD-10-CM | POA: Diagnosis not present

## 2023-08-12 DIAGNOSIS — L539 Erythematous condition, unspecified: Secondary | ICD-10-CM | POA: Diagnosis present

## 2023-08-12 DIAGNOSIS — I10 Essential (primary) hypertension: Secondary | ICD-10-CM | POA: Diagnosis not present

## 2023-08-12 LAB — CBC WITH DIFFERENTIAL/PLATELET
Abs Immature Granulocytes: 0.04 10*3/uL (ref 0.00–0.07)
Basophils Absolute: 0.1 10*3/uL (ref 0.0–0.1)
Basophils Relative: 1 %
Eosinophils Absolute: 0 10*3/uL (ref 0.0–0.5)
Eosinophils Relative: 0 %
HCT: 42.5 % (ref 36.0–46.0)
Hemoglobin: 13.9 g/dL (ref 12.0–15.0)
Immature Granulocytes: 0 %
Lymphocytes Relative: 37 %
Lymphs Abs: 3.4 10*3/uL (ref 0.7–4.0)
MCH: 30.8 pg (ref 26.0–34.0)
MCHC: 32.7 g/dL (ref 30.0–36.0)
MCV: 94.2 fL (ref 80.0–100.0)
Monocytes Absolute: 0.4 10*3/uL (ref 0.1–1.0)
Monocytes Relative: 5 %
Neutro Abs: 5.2 10*3/uL (ref 1.7–7.7)
Neutrophils Relative %: 57 %
Platelets: 306 10*3/uL (ref 150–400)
RBC: 4.51 MIL/uL (ref 3.87–5.11)
RDW: 13.2 % (ref 11.5–15.5)
WBC: 9.2 10*3/uL (ref 4.0–10.5)
nRBC: 0 % (ref 0.0–0.2)

## 2023-08-12 LAB — BASIC METABOLIC PANEL
Anion gap: 13 (ref 5–15)
BUN: 36 mg/dL — ABNORMAL HIGH (ref 8–23)
CO2: 21 mmol/L — ABNORMAL LOW (ref 22–32)
Calcium: 9.6 mg/dL (ref 8.9–10.3)
Chloride: 107 mmol/L (ref 98–111)
Creatinine, Ser: 1.23 mg/dL — ABNORMAL HIGH (ref 0.44–1.00)
GFR, Estimated: 50 mL/min — ABNORMAL LOW (ref >60)
Glucose, Bld: 174 mg/dL — ABNORMAL HIGH (ref 70–99)
Potassium: 3.9 mmol/L (ref 3.5–5.1)
Sodium: 141 mmol/L (ref 135–145)

## 2023-08-12 LAB — LACTIC ACID, PLASMA: Lactic Acid, Venous: 1.3 mmol/L (ref 0.5–1.9)

## 2023-08-12 LAB — CBG MONITORING, ED: Glucose-Capillary: 135 mg/dL — ABNORMAL HIGH (ref 70–99)

## 2023-08-12 MED ORDER — SODIUM CHLORIDE 0.9 % IV SOLN
1.0000 g | Freq: Once | INTRAVENOUS | Status: AC
  Administered 2023-08-12: 1 g via INTRAVENOUS
  Filled 2023-08-12: qty 10

## 2023-08-12 MED ORDER — AMOXICILLIN-POT CLAVULANATE 875-125 MG PO TABS: 1.0000 | ORAL_TABLET | Freq: Two times a day (BID) | ORAL | 0 refills | Status: AC

## 2023-08-12 MED ORDER — AMOXICILLIN-POT CLAVULANATE 875-125 MG PO TABS
1.0000 | ORAL_TABLET | Freq: Once | ORAL | Status: AC
  Administered 2023-08-12: 1 via ORAL
  Filled 2023-08-12: qty 1

## 2023-08-12 NOTE — ED Triage Notes (Signed)
Pt injured right leg 10/31 and has been treating it with oral and topical atx and it's not getting better and was told by PCP to come here to get IV atx.Pt has a wound 1.5cmx1.3cm on anterior of right lower leg. The bedding of wound has green scab on it. The skin is erythematous surround the entire wound.Pt states the reddness has expaneded

## 2023-08-12 NOTE — ED Provider Notes (Signed)
[place] EMERGENCY DEPARTMENT AT [facility] Provider Note   CSN: [id number] Arrival date & time: 08/12/23  1430     History  No chief complaint on file.   Holly Spencer is a 62 y.o. female with a history of diabetes mellitus and hypertension who presents to the ED today for a leg wound.  Patient reports she had a right shin on the back of the trailer hitch of her car at the end of October.  She followed up with her primary care provider who advised her to use topical antibiotic ointments and clean the wound.  She states that she has been doing so but the wound has continued to grow.  Evaluated at urgent care 12/18 and started on Doxycycline at the time.  Patient has completed the full course of the antibiotic but states that the redness around the wound has continued to spread and that she feels throbbing at the site of the wound.  She called her primary care earlier today and they advised her to come here for IV antibiotics.  She denies any fevers, warmth to touch, or discharge at the wound site.  No additional complaints or concerns at this time.    Home Medications Prior to Admission medications   Medication Sig Start Date End Date Taking? Authorizing Provider  amoxicillin-clavulanate (AUGMENTIN) 875-125 MG tablet Take 1 tablet by mouth every 12 (twelve) hours for 7 days. 08/12/23 08/19/23 Yes Maxwell Marion, PA-C  amLODipine (NORVASC) 5 MG tablet TAKE 1 TABLET (5 MG TOTAL) DAILY BY MOUTH. 06/15/18   Shade Flood, MD  Blood Glucose Monitoring Suppl (BLOOD GLUCOSE METER) kit Use as instructed 05/25/12   Valarie Cones, Dema Severin, PA-C  doxycycline (MONODOX) 100 MG capsule Take 1 capsule (100 mg total) by mouth 2 (two) times daily for 7 days. 08/06/23 08/13/23  Meliton Rattan, PA  FINGERSTIX LANCETS MISC To check blood sugar tid/ 250.00 dx code uncontrolled diabetes 08/07/13   Elvina Sidle, MD  glipiZIDE (GLUCOTROL) 5 MG tablet TAKE 1 TABLET (5 MG TOTAL) 2 (TWO) TIMES DAILY  BEFORE A MEAL BY MOUTH. 10/06/18   Shade Flood, MD  Homeopathic Products [facility] ALLERGY RELIEF NA) Place 1 spray into the nose 2 (two) times daily as needed (allergies).    [provider]  JARDIANCE 10 MG TABS tablet Take 1 tablet by mouth daily. 07/23/22   [provider]  metFORMIN (GLUCOPHAGE-XR) 750 MG 24 hr tablet TAKE 1 TABLET (750 MG TOTAL) DAILY BY MOUTH. 03/09/18   Shade Flood, MD  mupirocin ointment (BACTROBAN) 2 % Apply 1 Application topically 3 (three) times daily for 10 days. 08/06/23 08/16/23  Meliton Rattan, PA  valsartan-hydrochlorothiazide (DIOVAN-HCT) 320-12.5 MG tablet TAKE 1 TABLET BY MOUTH EVERY DAY 06/15/18   Shade Flood, MD      Allergies    Patient has no known allergies.    Review of Systems   Review of Systems  Skin:  Positive for wound.  All other systems reviewed and are negative.   Physical Exam Updated Vital Signs BP 128/69 (BP Location: Right Arm)   Pulse 74   Temp 98.2 F (36.8 C) (Oral)   Resp 18   Ht 5\' 4"  (1.626 m)   Wt 89.8 kg   SpO2 98%   BMI 33.98 kg/m  Physical Exam Vitals and nursing note reviewed.  Constitutional:      General: She is not in acute distress.    Appearance: Normal appearance.  HENT:  Head: Normocephalic and atraumatic.     Mouth/Throat:     Mouth: Mucous membranes are moist.  Eyes:     Conjunctiva/sclera: Conjunctivae normal.     Pupils: Pupils are equal, round, and reactive to light.  Cardiovascular:     Rate and Rhythm: Normal rate and regular rhythm.     Pulses: Normal pulses.     Heart sounds: Normal heart sounds.  Pulmonary:     Effort: Pulmonary effort is normal.     Breath sounds: Normal breath sounds.  Musculoskeletal:        General: Normal range of motion.     Comments: No swelling to the right calf or tenderness to touch. Compartment is soft. Palpable dorsalis pedis pulse. No right lower extremity weakness or loss of sensation.  Skin:    General: Skin is warm  and dry.     Findings: Erythema present. No rash.     Comments: Erythema surrounding brown/green scab on right lower shin  Neurological:     General: No focal deficit present.     Mental Status: She is alert.     Sensory: No sensory deficit.     Motor: No weakness.  Psychiatric:        Mood and Affect: Mood normal.        Behavior: Behavior normal.    ED Results / Procedures / Treatments   Labs (all labs ordered are listed, but only abnormal results are displayed) Labs Reviewed  BASIC METABOLIC PANEL - Abnormal; Notable for the following components:      Result Value   CO2 21 (*)    Glucose, Bld 174 (*)    BUN 36 (*)    Creatinine, Ser 1.23 (*)    GFR, Estimated 50 (*)    All other components within normal limits  CBG MONITORING, ED - Abnormal; Notable for the following components:   Glucose-Capillary 135 (*)    All other components within normal limits  CULTURE, BLOOD (ROUTINE X 2)  CULTURE, BLOOD (ROUTINE X 2)  CBC WITH DIFFERENTIAL/PLATELET  LACTIC ACID, PLASMA    EKG None  Radiology No results found.  Procedures Procedures: not indicated.   Medications Ordered in ED Medications  cefTRIAXone (ROCEPHIN) 1 g in sodium chloride 0.9 % 100 mL IVPB (1 g Intravenous New Bag/Given 08/12/23 2010)  amoxicillin-clavulanate (AUGMENTIN) 875-125 MG per tablet 1 tablet (1 tablet Oral Given 08/12/23 2013)    ED Course/ Medical Decision Making/ A&P                                 Medical Decision Making Amount and/or Complexity of Data Reviewed Labs: ordered.  Risk Prescription drug management.   This patient presents to the ED for concern of leg wound, this involves an extensive number of treatment options, and is a complaint that carries with it a high risk of complications and morbidity.   Differential diagnosis includes: abrasion, laceration, cellulitis, sepsis, etc.   Comorbidities  See HPI above   Additional History  Additional history obtained from  prior urgent care notes.   Lab Tests  I ordered and personally interpreted labs.  The pertinent results include:   Elevated BUN and Cr otherwise BMP and CBC are within normal limits - no acute electrolyte derangement, infection, or anemia. Patient was tachycardic on arrival so lactic was ordered, results came back negative. Blood cultures pending.   Problem List / ED Course /  Critical Interventions / Medication Management  Worsening wound x1 month I discussed patient case with my attending, Dr. Lynelle Doctor, who states we can give her IV antibiotics in the ED and send her home on a different oral medication. I ordered medications including: IV Rocephin for cellulitis Oral hydration for dehydration First dose Augmentin given in the ED today  I have reviewed the patients home medicines and have made adjustments as needed   Social Determinants of Health  Access to healthcare   Test / Admission - Considered  Discussed findings with patient. She is stable and safe for discharge home. Return precautions given.        Final Clinical Impression(s) / ED Diagnoses Final diagnoses:  Cellulitis of right lower extremity    Rx / DC Orders ED Discharge Orders          Ordered    amoxicillin-clavulanate (AUGMENTIN) 875-125 MG tablet  Every 12 hours        08/12/23 1943              Maxwell Marion, PA-C 08/12/23 2027    Linwood Dibbles, MD 08/13/23 [phone]

## 2023-08-12 NOTE — Discharge Instructions (Signed)
As discussed, take Augmentin twice a day for the next 7 days. It takes about 2 days for antibiotics to kick in, so you should start seeing improvement on day 3. Follow up with your PCP in 5-7 days for reevaluation.  Get help right away if: You notice red streaks coming from the infected area. You notice the skin turns purple or black and falls off.

## 2023-08-12 NOTE — ED Notes (Signed)
Rocephin was given as ordered on/or near the initial time of order. The meds has been admin as ordered and was stopped after approx. 30 mins.

## 2023-08-17 LAB — CULTURE, BLOOD (ROUTINE X 2)
Culture: NO GROWTH
Culture: NO GROWTH
Special Requests: ADEQUATE
Special Requests: ADEQUATE

## 2023-08-25 ENCOUNTER — Other Ambulatory Visit: Payer: Self-pay | Admitting: Family Medicine

## 2023-08-25 DIAGNOSIS — Z1231 Encounter for screening mammogram for malignant neoplasm of breast: Secondary | ICD-10-CM

## 2023-09-25 IMAGING — MG MM DIGITAL SCREENING BILAT W/ TOMO AND CAD
6 of 10 series · 6 of 30 positions shown · non-contrast
Comparison: Previous exam(s).

CLINICAL DATA: Screening.

EXAM:
DIGITAL SCREENING BILATERAL MAMMOGRAM WITH TOMOSYNTHESIS AND CAD
TECHNIQUE: Bilateral screening digital craniocaudal and mediolateral oblique
mammograms were obtained. Bilateral screening digital breast
tomosynthesis was performed. The images were evaluated with
computer-aided detection.

[L MLO synth-2D]
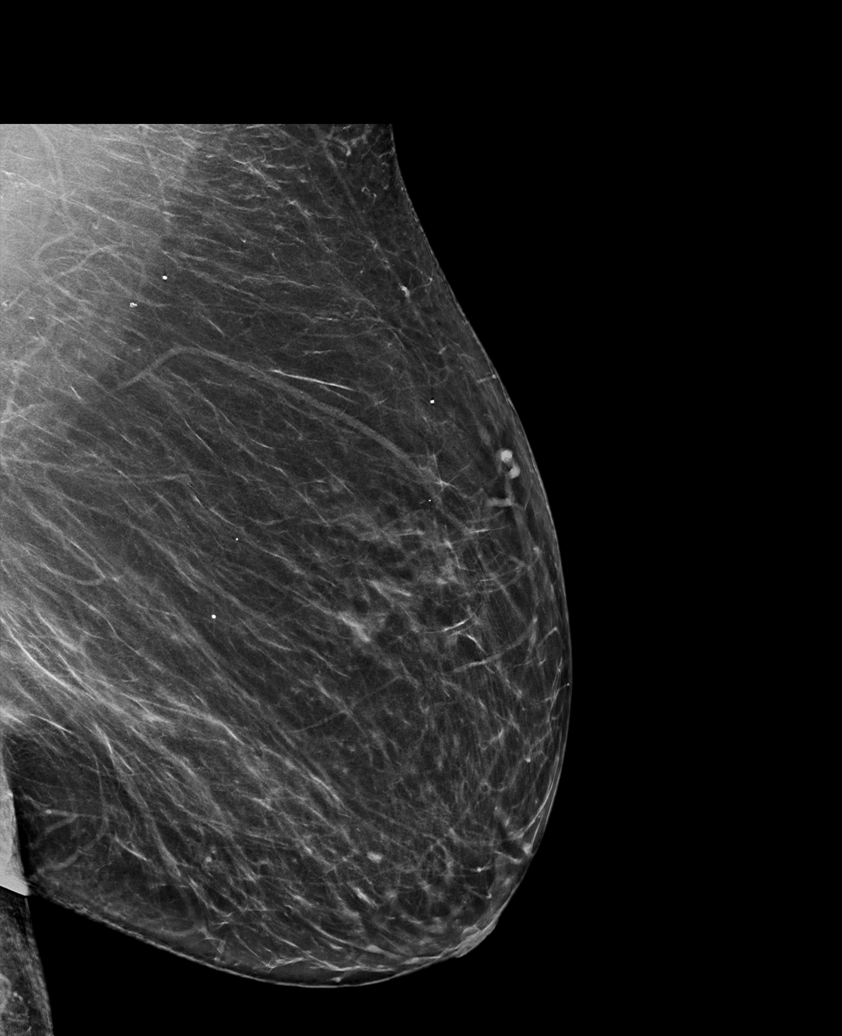

[R CC synth-2D (1 of 2)]
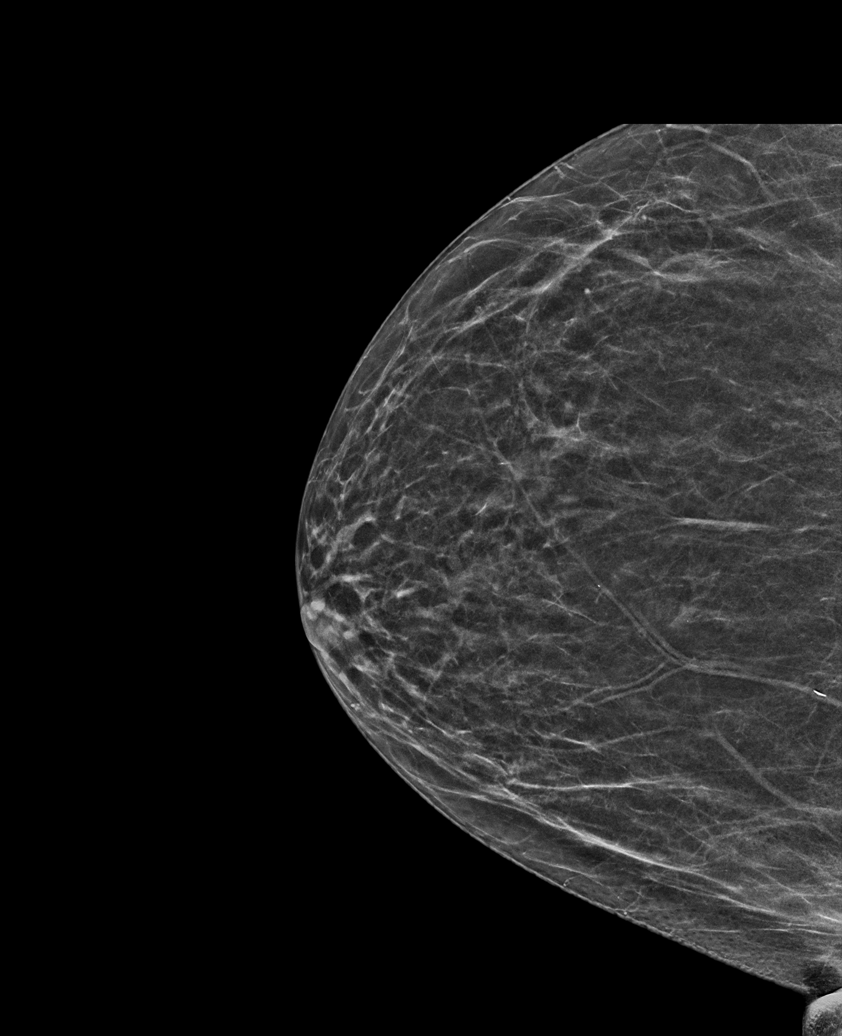

[R MLO synth-2D]
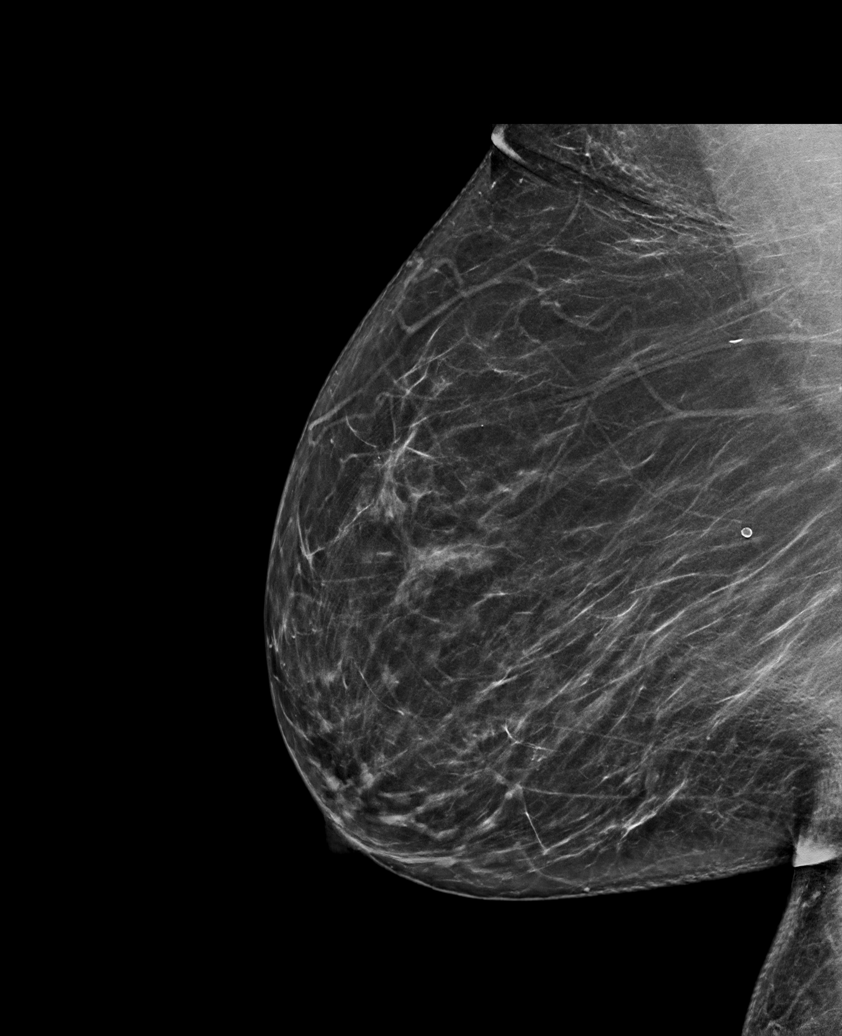

[R CC synth-2D (2 of 2)]
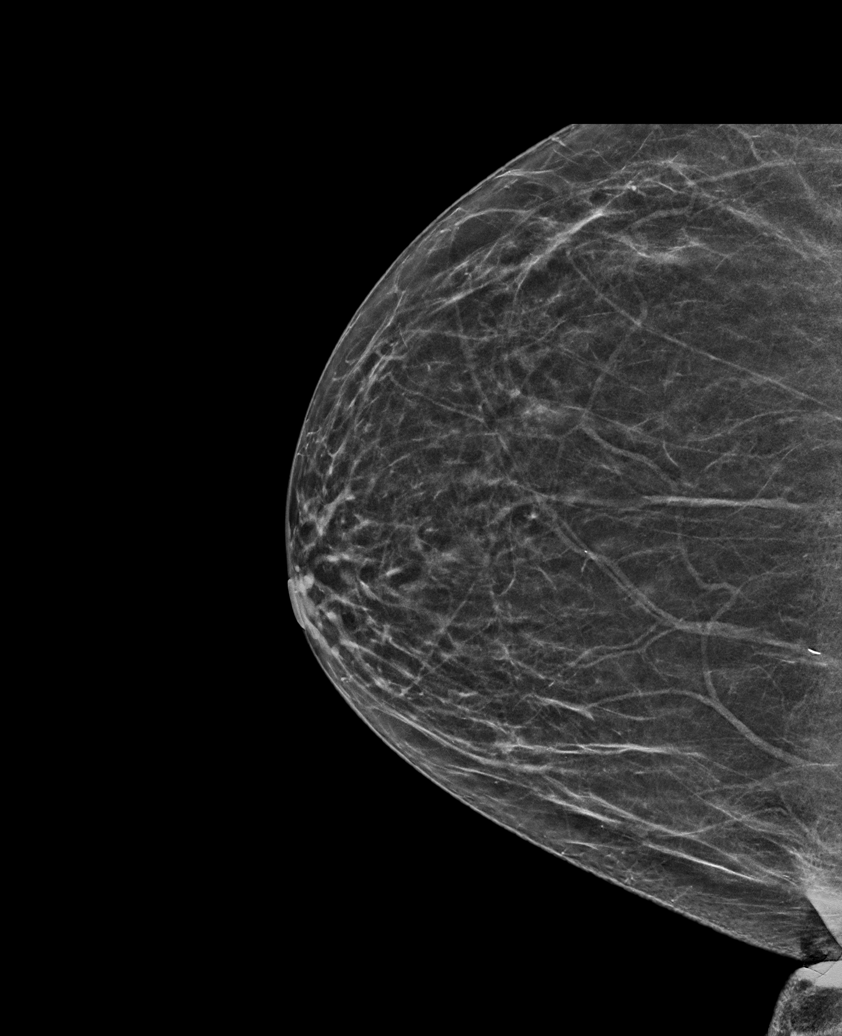

[L CC synth-2D]
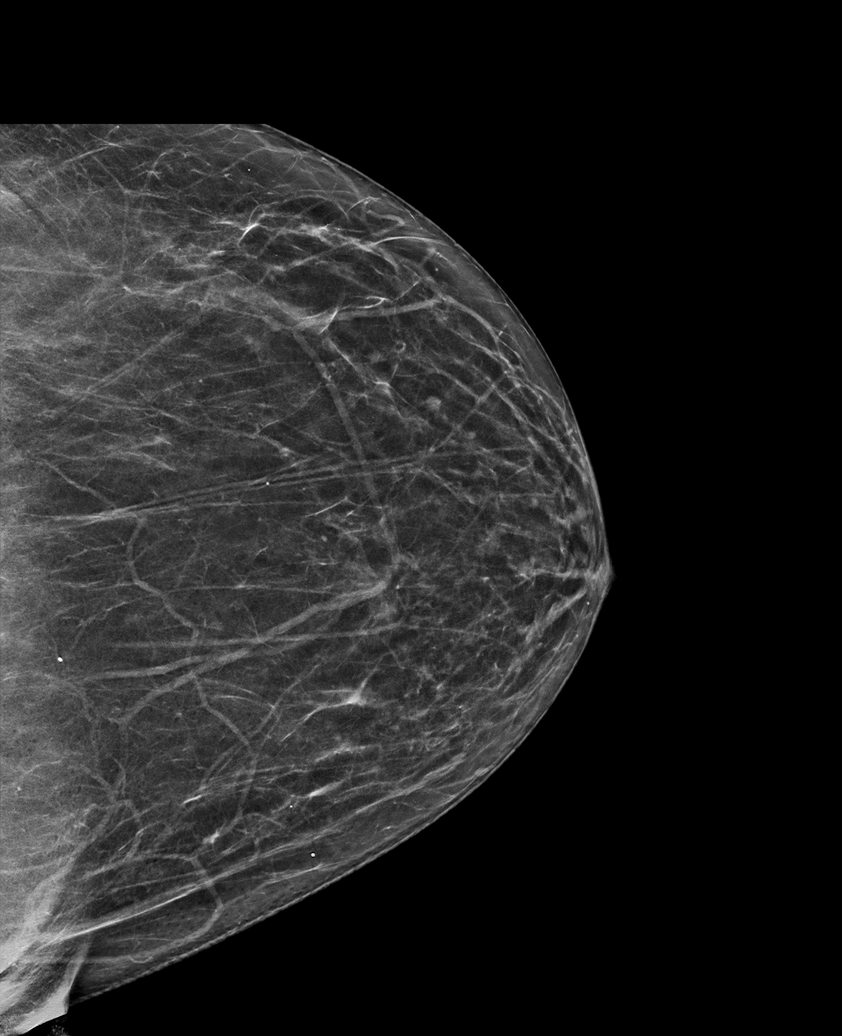

[L CC tomo · tomo slice 31/62.0]
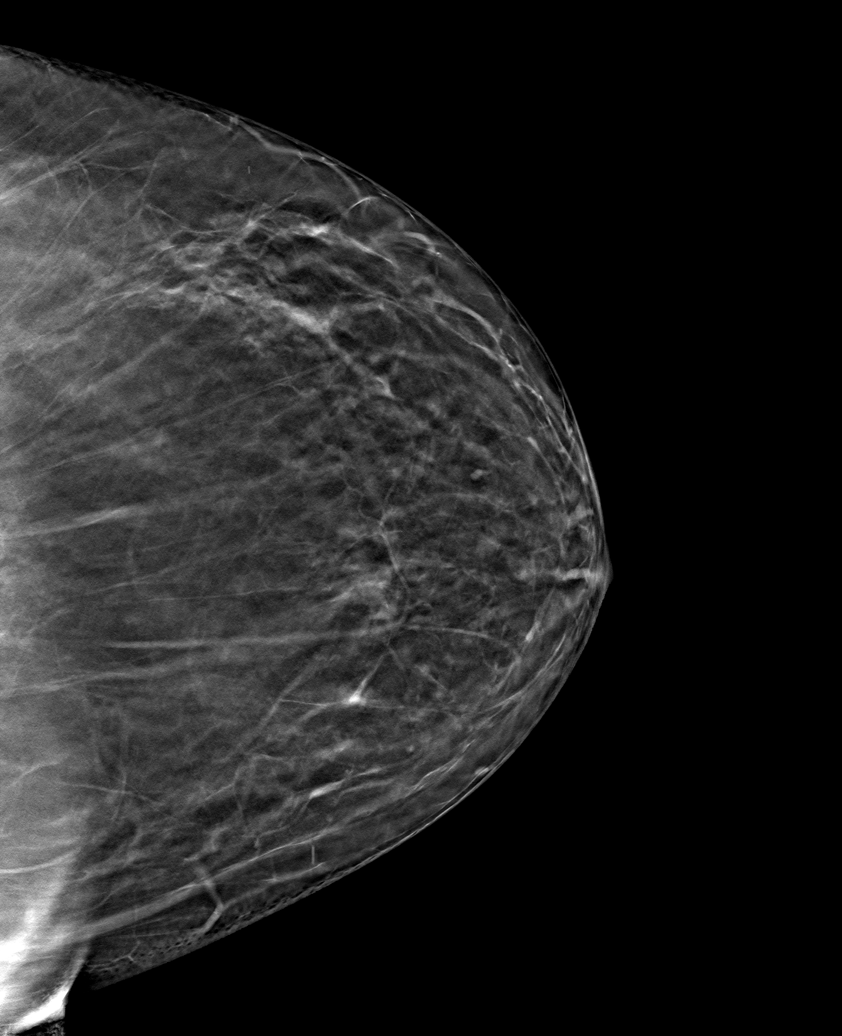

[6 of 30 positions shown; findings below may reference images not displayed]

ACR Breast Density Category b: There are scattered areas of
fibroglandular density.
FINDINGS: There are no findings suspicious for malignancy.
IMPRESSION: No mammographic evidence of malignancy. A result letter of this
screening mammogram will be mailed directly to the patient.

RECOMMENDATION:
Screening mammogram in one year. (Code:51-O-LD2)

BI-RADS CATEGORY  1: Negative.

## 2023-10-14 ENCOUNTER — Encounter (HOSPITAL_BASED_OUTPATIENT_CLINIC_OR_DEPARTMENT_OTHER): Payer: No Typology Code available for payment source | Attending: Internal Medicine | Admitting: Internal Medicine

## 2023-12-03 ENCOUNTER — Emergency Department (HOSPITAL_COMMUNITY)

## 2023-12-03 ENCOUNTER — Encounter (HOSPITAL_COMMUNITY): Payer: Self-pay

## 2023-12-03 ENCOUNTER — Observation Stay (HOSPITAL_COMMUNITY)
Admission: EM | Admit: 2023-12-03 | Discharge: 2023-12-05 | Disposition: A | Discharge status: Home / Self Care | Attending: Emergency Medicine | Admitting: Emergency Medicine

## 2023-12-03 ENCOUNTER — Other Ambulatory Visit: Payer: Self-pay

## 2023-12-03 DIAGNOSIS — Z7901 Long term (current) use of anticoagulants: Secondary | ICD-10-CM | POA: Diagnosis not present

## 2023-12-03 DIAGNOSIS — Z1152 Encounter for screening for COVID-19: Secondary | ICD-10-CM | POA: Diagnosis not present

## 2023-12-03 DIAGNOSIS — I129 Hypertensive chronic kidney disease with stage 1 through stage 4 chronic kidney disease, or unspecified chronic kidney disease: Secondary | ICD-10-CM | POA: Diagnosis not present

## 2023-12-03 DIAGNOSIS — R000 Tachycardia, unspecified: Secondary | ICD-10-CM | POA: Diagnosis present

## 2023-12-03 DIAGNOSIS — L97219 Non-pressure chronic ulcer of right calf with unspecified severity: Secondary | ICD-10-CM | POA: Insufficient documentation

## 2023-12-03 DIAGNOSIS — E1110 Type 2 diabetes mellitus with ketoacidosis without coma: Secondary | ICD-10-CM | POA: Insufficient documentation

## 2023-12-03 DIAGNOSIS — I2510 Atherosclerotic heart disease of native coronary artery without angina pectoris: Secondary | ICD-10-CM | POA: Diagnosis not present

## 2023-12-03 DIAGNOSIS — E785 Hyperlipidemia, unspecified: Secondary | ICD-10-CM | POA: Insufficient documentation

## 2023-12-03 DIAGNOSIS — D751 Secondary polycythemia: Secondary | ICD-10-CM | POA: Insufficient documentation

## 2023-12-03 DIAGNOSIS — E1122 Type 2 diabetes mellitus with diabetic chronic kidney disease: Secondary | ICD-10-CM | POA: Insufficient documentation

## 2023-12-03 DIAGNOSIS — N1832 Chronic kidney disease, stage 3b: Secondary | ICD-10-CM | POA: Insufficient documentation

## 2023-12-03 DIAGNOSIS — Z79899 Other long term (current) drug therapy: Secondary | ICD-10-CM | POA: Diagnosis not present

## 2023-12-03 DIAGNOSIS — Z7984 Long term (current) use of oral hypoglycemic drugs: Secondary | ICD-10-CM | POA: Diagnosis not present

## 2023-12-03 DIAGNOSIS — I10 Essential (primary) hypertension: Secondary | ICD-10-CM | POA: Diagnosis present

## 2023-12-03 DIAGNOSIS — N179 Acute kidney failure, unspecified: Secondary | ICD-10-CM | POA: Insufficient documentation

## 2023-12-03 DIAGNOSIS — E119 Type 2 diabetes mellitus without complications: Secondary | ICD-10-CM

## 2023-12-03 DIAGNOSIS — R0600 Dyspnea, unspecified: Principal | ICD-10-CM

## 2023-12-03 LAB — COMPREHENSIVE METABOLIC PANEL WITH GFR
ALT: 26 U/L (ref 0–44)
AST: 22 U/L (ref 15–41)
Albumin: 4.6 g/dL (ref 3.5–5.0)
Alkaline Phosphatase: 108 U/L (ref 38–126)
Anion gap: 18 — ABNORMAL HIGH (ref 5–15)
BUN: 35 mg/dL — ABNORMAL HIGH (ref 8–23)
CO2: 21 mmol/L — ABNORMAL LOW (ref 22–32)
Calcium: 10.9 mg/dL — ABNORMAL HIGH (ref 8.9–10.3)
Chloride: 99 mmol/L (ref 98–111)
Creatinine, Ser: 1.41 mg/dL — ABNORMAL HIGH (ref 0.44–1.00)
GFR, Estimated: 42 mL/min — ABNORMAL LOW (ref >60)
Glucose, Bld: 194 mg/dL — ABNORMAL HIGH (ref 70–99)
Potassium: 3.6 mmol/L (ref 3.5–5.1)
Sodium: 138 mmol/L (ref 135–145)
Total Bilirubin: 0.9 mg/dL (ref 0.0–1.2)
Total Protein: 8.7 g/dL — ABNORMAL HIGH (ref 6.5–8.1)

## 2023-12-03 LAB — TROPONIN I (HIGH SENSITIVITY)
Troponin I (High Sensitivity): 21 ng/L — ABNORMAL HIGH (ref <18)
Troponin I (High Sensitivity): 18 ng/L — ABNORMAL HIGH (ref <18)

## 2023-12-03 LAB — T4, FREE: Free T4: 0.89 ng/dL (ref 0.61–1.12)

## 2023-12-03 LAB — BRAIN NATRIURETIC PEPTIDE: B Natriuretic Peptide: 114.6 pg/mL — ABNORMAL HIGH (ref 0.0–100.0)

## 2023-12-03 LAB — CBC
HCT: 49.4 % — ABNORMAL HIGH (ref 36.0–46.0)
Hemoglobin: 16.5 g/dL — ABNORMAL HIGH (ref 12.0–15.0)
MCH: 31 pg (ref 26.0–34.0)
MCHC: 33.4 g/dL (ref 30.0–36.0)
MCV: 92.9 fL (ref 80.0–100.0)
Platelets: 313 10*3/uL (ref 150–400)
RBC: 5.32 MIL/uL — ABNORMAL HIGH (ref 3.87–5.11)
RDW: 13.4 % (ref 11.5–15.5)
WBC: 16.8 10*3/uL — ABNORMAL HIGH (ref 4.0–10.5)
nRBC: 0 % (ref 0.0–0.2)

## 2023-12-03 LAB — CBG MONITORING, ED
Glucose-Capillary: 153 mg/dL — ABNORMAL HIGH (ref 70–99)
Glucose-Capillary: 187 mg/dL — ABNORMAL HIGH (ref 70–99)

## 2023-12-03 LAB — I-STAT CG4 LACTIC ACID, ED
Lactic Acid, Venous: 1 mmol/L (ref 0.5–1.9)
Lactic Acid, Venous: 1.5 mmol/L (ref 0.5–1.9)

## 2023-12-03 LAB — RESP PANEL BY RT-PCR (RSV, FLU A&B, COVID)  RVPGX2
Influenza A by PCR: NEGATIVE
Influenza B by PCR: NEGATIVE
Resp Syncytial Virus by PCR: NEGATIVE
SARS Coronavirus 2 by RT PCR: NEGATIVE

## 2023-12-03 LAB — I-STAT CHEM 8, ED
BUN: 38 mg/dL — ABNORMAL HIGH (ref 8–23)
Calcium, Ion: 1.08 mmol/L — ABNORMAL LOW (ref 1.15–1.40)
Chloride: 105 mmol/L (ref 98–111)
Creatinine, Ser: 1.5 mg/dL — ABNORMAL HIGH (ref 0.44–1.00)
Glucose, Bld: 197 mg/dL — ABNORMAL HIGH (ref 70–99)
HCT: 47 % — ABNORMAL HIGH (ref 36.0–46.0)
Hemoglobin: 16 g/dL — ABNORMAL HIGH (ref 12.0–15.0)
Potassium: 3.6 mmol/L (ref 3.5–5.1)
Sodium: 138 mmol/L (ref 135–145)
TCO2: 24 mmol/L (ref 22–32)

## 2023-12-03 LAB — HEMOGLOBIN A1C
Hgb A1c MFr Bld: 6.2 % — ABNORMAL HIGH (ref 4.8–5.6)
Mean Plasma Glucose: 131.24 mg/dL

## 2023-12-03 LAB — TSH: TSH: 0.486 u[IU]/mL (ref 0.350–4.500)

## 2023-12-03 LAB — MAGNESIUM: Magnesium: 2.5 mg/dL — ABNORMAL HIGH (ref 1.7–2.4)

## 2023-12-03 MED ORDER — SODIUM CHLORIDE 0.9% FLUSH: 3.0000 mL | INTRAVENOUS | Status: DC | PRN

## 2023-12-03 MED ORDER — ACETAMINOPHEN 650 MG RE SUPP: 650.0000 mg | Freq: Four times a day (QID) | RECTAL | Status: DC | PRN

## 2023-12-03 MED ORDER — HEPARIN SODIUM (PORCINE) 5000 UNIT/ML IJ SOLN
5000.0000 [IU] | Freq: Three times a day (TID) | INTRAMUSCULAR | Status: DC
  Administered 2023-12-03 – 2023-12-05 (×5): 5000 [IU] via SUBCUTANEOUS
  Filled 2023-12-03 (×5): qty 1

## 2023-12-03 MED ORDER — SODIUM CHLORIDE 0.9% FLUSH
3.0000 mL | Freq: Two times a day (BID) | INTRAVENOUS | Status: DC
  Administered 2023-12-03 – 2023-12-04 (×2): 10 mL via INTRAVENOUS
  Administered 2023-12-04: 3 mL via INTRAVENOUS

## 2023-12-03 MED ORDER — ONDANSETRON HCL 4 MG/2ML IJ SOLN
4.0000 mg | Freq: Once | INTRAMUSCULAR | Status: AC
  Administered 2023-12-03: 4 mg via INTRAVENOUS
  Filled 2023-12-03: qty 2

## 2023-12-03 MED ORDER — CARVEDILOL 6.25 MG PO TABS
6.2500 mg | ORAL_TABLET | Freq: Two times a day (BID) | ORAL | Status: DC
  Administered 2023-12-03 – 2023-12-05 (×4): 6.25 mg via ORAL
  Filled 2023-12-03: qty 2
  Filled 2023-12-03 (×2): qty 1
  Filled 2023-12-03: qty 2

## 2023-12-03 MED ORDER — LACTATED RINGERS IV BOLUS
1000.0000 mL | Freq: Once | INTRAVENOUS | Status: AC
  Administered 2023-12-03: 1000 mL via INTRAVENOUS

## 2023-12-03 MED ORDER — INSULIN ASPART 100 UNIT/ML IJ SOLN
0.0000 [IU] | Freq: Three times a day (TID) | INTRAMUSCULAR | Status: DC
  Administered 2023-12-03 – 2023-12-04 (×3): 3 [IU] via SUBCUTANEOUS
  Administered 2023-12-04: 2 [IU] via SUBCUTANEOUS
  Administered 2023-12-05: 3 [IU] via SUBCUTANEOUS
  Administered 2023-12-05: 2 [IU] via SUBCUTANEOUS

## 2023-12-03 MED ORDER — ONDANSETRON HCL 4 MG/2ML IJ SOLN
4.0000 mg | Freq: Four times a day (QID) | INTRAMUSCULAR | Status: AC | PRN
  Administered 2023-12-03 – 2023-12-04 (×2): 4 mg via INTRAVENOUS
  Filled 2023-12-03 (×2): qty 2

## 2023-12-03 MED ORDER — MORPHINE SULFATE (PF) 2 MG/ML IV SOLN: 2.0000 mg | INTRAVENOUS | Status: DC | PRN

## 2023-12-03 MED ORDER — ACETAMINOPHEN 325 MG PO TABS: 650.0000 mg | ORAL_TABLET | Freq: Four times a day (QID) | ORAL | Status: DC | PRN

## 2023-12-03 MED ORDER — SODIUM CHLORIDE 0.9% FLUSH
3.0000 mL | Freq: Two times a day (BID) | INTRAVENOUS | Status: DC
  Administered 2023-12-03 – 2023-12-05 (×5): 3 mL via INTRAVENOUS

## 2023-12-03 MED ORDER — CLONIDINE HCL 0.2 MG/24HR TD PTWK: 0.2000 mg | MEDICATED_PATCH | TRANSDERMAL | Status: DC

## 2023-12-03 MED ORDER — IOHEXOL 350 MG/ML SOLN
75.0000 mL | Freq: Once | INTRAVENOUS | Status: AC | PRN
Start: 2023-12-03 — End: 2023-12-03
  Administered 2023-12-03: 75 mL via INTRAVENOUS

## 2023-12-03 MED ORDER — HYDROCODONE-ACETAMINOPHEN 5-325 MG PO TABS: 1.0000 | ORAL_TABLET | ORAL | Status: DC | PRN

## 2023-12-03 MED ORDER — ASPIRIN 81 MG PO CHEW
324.0000 mg | CHEWABLE_TABLET | Freq: Once | ORAL | Status: AC
  Administered 2023-12-03: 324 mg via ORAL
  Filled 2023-12-03: qty 4

## 2023-12-03 MED ORDER — LACTATED RINGERS IV SOLN: INTRAVENOUS | Status: DC

## 2023-12-03 NOTE — H&P (Addendum)
 History and Physical    Patient: Holly Spencer:[id number] DOB: 07-Nov-1960 DOA: 12/03/2023 DOS: the patient was seen and examined on 12/03/2023 PCP: Felix Pacini, FNP  Patient coming from: Home Chief complaint: Chief Complaint  Patient presents with   Tachycardia   Shortness of Breath   Weakness   HPI:  Holly Spencer is a 63 y.o. female with past medical history  of morbid obesity with a BMI of 33.98, diabetes mellitus type 2, essential hypertension, hyperlipidemia, CKD stage IIIb presenting today to the emergency room sent from the wound clinic for generalized weakness shortness of breath and tachycardia.  Patient also reported dizziness along with her shortness of breath and chest pain for the past 2 days.  No reports of dizziness NSAID use bleeding falls nausea vomiting diarrhea.  ED Course: Pt in ed at bedside  is alert awake oriented. Vital signs in the ED were notable for the following:  Vitals:   12/03/23 1545 12/03/23 1716 12/03/23 1753 12/03/23 1830  BP: (!) 185/89 (!) 193/85  (!) 168/83  Pulse: 100 98  92  Temp:   98.6 F (37 C)   Resp: 18   17  Height:      Weight:      SpO2: 97%   91%  TempSrc:   Oral   BMI (Calculated):       >>ED evaluation thus far shows: Initial CMP showed glucose of 194 creatinine 1.41 calcium 10.9 anion gap of 18 protein 8.7 GFR 42. Troponin of 21. CBC shows leukocytosis of 16.8 hemoglobin of 16.5 platelets of 313. Viral panel is negative for flu RSV and COVID. CT angio chest PE protocol negative for PE but does show bilateral adrenal nodular thickening. Patient had tibia-fibula x-ray right which was negative for any acute bony abnormality. Patient had chest x-ray which was showing normal pulmonary vasculature.  Left lingular infiltrate with recommendations for repeat chest x-ray.   >>While in the ED patient received the following: Medications  insulin aspart (novoLOG) injection 0-15 Units (3 Units Subcutaneous Given  12/03/23 1723)  heparin injection 5,000 Units (5,000 Units Subcutaneous Given 12/03/23 1716)  sodium chloride flush (NS) 0.9 % injection 3 mL (has no administration in time range)  acetaminophen (TYLENOL) tablet 650 mg (has no administration in time range)    Or  acetaminophen (TYLENOL) suppository 650 mg (has no administration in time range)  HYDROcodone-acetaminophen (NORCO/VICODIN) 5-325 MG per tablet 1 tablet (has no administration in time range)  morphine (PF) 2 MG/ML injection 2 mg (has no administration in time range)  sodium chloride flush (NS) 0.9 % injection 3-10 mL (has no administration in time range)  sodium chloride flush (NS) 0.9 % injection 3-10 mL (has no administration in time range)  carvedilol (COREG) tablet 6.25 mg (6.25 mg Oral Given 12/03/23 1716)  lactated ringers infusion ( Intravenous New Bag/Given 12/03/23 1951)  ondansetron (ZOFRAN) injection 4 mg (4 mg Intravenous Given 12/03/23 1951)  aspirin chewable tablet 324 mg (324 mg Oral Given 12/03/23 1021)  iohexol (OMNIPAQUE) 350 MG/ML injection 75 mL (75 mLs Intravenous Contrast Given 12/03/23 1148)  ondansetron (ZOFRAN) injection 4 mg (4 mg Intravenous Given 12/03/23 1546)  lactated ringers bolus 1,000 mL (0 mLs Intravenous Stopped 12/03/23 1836)   Review of Systems  Neurological:  Positive for weakness.   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 She has never used smokeless tobacco. She reports that she does not drink alcohol and does not use drugs. No Known Allergies Family History  Problem Relation Age of Onset   Diabetes type I Father        Deceased   Asthma Mother        Living, 40   Diabetes Mellitus II Mother    Diabetes Mellitus II Sister    Hypertension  Sister    Healthy Daughter    Healthy Son    Prior to Admission medications   Medication Sig Start Date End Date Taking? Authorizing Provider  amLODipine (NORVASC) 5 MG tablet TAKE 1 TABLET (5 MG TOTAL) DAILY BY MOUTH. 06/15/18   Shade Flood, MD  Blood Glucose Monitoring Suppl (BLOOD GLUCOSE METER) kit Use as instructed 05/25/12   Valarie Cones, Dema Severin, PA-C  FINGERSTIX LANCETS MISC To check blood sugar tid/ 250.00 dx code uncontrolled diabetes 08/07/13   Elvina Sidle, MD  glipiZIDE (GLUCOTROL) 5 MG tablet TAKE 1 TABLET (5 MG TOTAL) 2 (TWO) TIMES DAILY BEFORE A MEAL BY MOUTH. 10/06/18   Shade Flood, MD  Homeopathic Products [facility] ALLERGY RELIEF NA) Place 1 spray into the nose 2 (two) times daily as needed (allergies).    [provider]  JARDIANCE 10 MG TABS tablet Take 1 tablet by mouth daily. 07/23/22   [provider]  metFORMIN (GLUCOPHAGE-XR) 750 MG 24 hr tablet TAKE 1 TABLET (750 MG TOTAL) DAILY BY MOUTH. 03/09/18   Shade Flood, MD  valsartan-hydrochlorothiazide (DIOVAN-HCT) 320-12.5 MG tablet TAKE 1 TABLET BY MOUTH EVERY DAY 06/15/18   Shade Flood, MD                                                                                 Vitals:   12/03/23 1545 12/03/23 1716 12/03/23 1753 12/03/23 1830  BP: (!) 185/89 (!) 193/85  (!) 168/83  Pulse: 100 98  92  Resp: 18   17  Temp:   98.6 F (37 C)   TempSrc:   Oral   SpO2: 97%   91%  Weight:      Height:       Physical Exam Vitals and nursing note reviewed.  Constitutional:      General: She is not in acute distress. HENT:     Head: Normocephalic and atraumatic.     Right Ear: Hearing normal.     Left Ear: Hearing normal.     Nose: Nose normal. No nasal deformity.     Mouth/Throat:     Lips: Pink.     Tongue: No lesions.     Pharynx: Oropharynx is clear.  Eyes:     General: Lids are normal.     Extraocular Movements: Extraocular movements intact.  Cardiovascular:     Rate and Rhythm:  Normal rate and regular rhythm.     Heart sounds: Normal heart sounds.  Pulmonary:     Effort: Pulmonary effort is normal.     Breath sounds: Normal breath sounds.  Abdominal:  General: Bowel sounds are normal. There is no distension.     Palpations: Abdomen is soft. There is no mass.     Tenderness: There is no abdominal tenderness.  Musculoskeletal:     Right lower leg: No edema.     Left lower leg: No edema.  Skin:    General: Skin is warm.  Neurological:     General: No focal deficit present.     Mental Status: She is alert and oriented to person, place, and time.     Cranial Nerves: Cranial nerves 2-12 are intact.  Psychiatric:        Attention and Perception: Attention normal.        Mood and Affect: Mood normal.        Speech: Speech normal.        Behavior: Behavior normal. Behavior is cooperative.     Labs on Admission: I have personally reviewed following labs and imaging studies CBC: Recent Labs  Lab 12/03/23 1023 12/03/23 1033  WBC 16.8*  --   HGB 16.5* 16.0*  HCT 49.4* 47.0*  MCV 92.9  --   PLT 313  --    Basic Metabolic Panel: Recent Labs  Lab 12/03/23 1023 12/03/23 1033 12/03/23 1827  NA 138 138  --   K 3.6 3.6  --   CL 99 105  --   CO2 21*  --   --   GLUCOSE 194* 197*  --   BUN 35* 38*  --   CREATININE 1.41* 1.50*  --   CALCIUM 10.9*  --   --   MG  --   --  2.5*   GFR: Estimated Creatinine Clearance: 42.7 mL/min (A) (by C-G formula based on SCr of 1.5 mg/dL (H)). Liver Function Tests: Recent Labs  Lab 12/03/23 1023  AST 22  ALT 26  ALKPHOS 108  BILITOT 0.9  PROT 8.7*  ALBUMIN 4.6   No results for input(s): "LIPASE", "AMYLASE" in the last 168 hours. No results for input(s): "AMMONIA" in the last 168 hours. Coagulation Profile: No results for input(s): "INR", "PROTIME" in the last 168 hours. Cardiac Enzymes: No results for input(s): "CKTOTAL", "CKMB", "CKMBINDEX", "TROPONINI" in the last 168 hours. BNP (last 3 results) No  results for input(s): "PROBNP" in the last 8760 hours. HbA1C: Recent Labs    12/03/23 1827  HGBA1C 6.2*   CBG: Recent Labs  Lab 12/03/23 1719  GLUCAP 187*   Lipid Profile: No results for input(s): "CHOL", "HDL", "LDLCALC", "TRIG", "CHOLHDL", "LDLDIRECT" in the last 72 hours. Thyroid Function Tests: Recent Labs    12/03/23 1827  TSH 0.486  FREET4 0.89    Radiological Exams on Admission: DG Tibia/Fibula Right Result Date: 12/03/2023 CLINICAL DATA:  Ulcerative wound anterior mid tibia EXAM: RIGHT TIBIA AND FIBULA - 2 VIEW COMPARISON:  None Available. FINDINGS: Degenerative changes in the right knee with joint space narrowing and spurring. No joint effusion. No acute bony abnormality. Specifically, no fracture, subluxation, or dislocation. No bone destruction, particularly in the mid tibial region. No soft tissue gas or radiopaque foreign body. IMPRESSION: No acute bony abnormality. Electronically Signed   By: Janeece Mechanic M.D.   On: 12/03/2023 17:10   CT Angio Chest PE W and/or Wo Contrast Result Date: 12/03/2023 CLINICAL DATA:  Pulmonary embolism (PE) suspected, high prob, dizziness, chest pain, dyspnea, tachycardia. EXAM: CT ANGIOGRAPHY CHEST WITH CONTRAST TECHNIQUE: Multidetector CT imaging of the chest was performed using the standard protocol during bolus administration of intravenous contrast. Multiplanar  CT image reconstructions and MIPs were obtained to evaluate the vascular anatomy. RADIATION DOSE REDUCTION: This exam was performed according to the departmental dose-optimization program which includes automated exposure control, adjustment of the mA and/or kV according to patient size and/or use of iterative reconstruction technique. CONTRAST:  75mL OMNIPAQUE IOHEXOL 350 MG/ML SOLN COMPARISON:  December 03, 2023, August 11, 2015 FINDINGS: Pulmonary Embolism: No pulmonary embolism. Apparent filling defect within a segmental branch in the anterior right upper lobe (axial 53), likely  artifactual due to the dense contrast column in the SVC. Cardiovascular: No cardiomegaly or pericardial effusion. No aortic aneurysm. Dense multi-vessel coronary atherosclerosis. Focal mild-to-moderate stenosis of the proximal left subclavian artery from noncalcified plaque (axial 26). Normal variant 2 vessel aortic arch anatomy. Scattered aortic atherosclerosis. Mediastinum/Nodes: No mediastinal mass. No mediastinal, hilar, or axillary lymphadenopathy. Lungs/Pleura: The midline trachea and bronchi are patent. No focal airspace consolidation, pleural effusion, or pneumothorax. Posterior bibasilar dependent atelectasis. 2 mm micronodule abutting the horizontal fissure, likely an intrafissural lymph node. Musculoskeletal: No acute fracture or destructive bone lesion. Multilevel degenerative disc disease of the spine. 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 Segmental absence of the lateral right fifth rib. Bony calculus surrounding the proximal right humeral neck. Upper Abdomen: No acute abnormality in the partially visualized upper abdomen. Nodular thickening of both adrenal glands. Review of the MIP images confirms the above findings. IMPRESSION: No acute intrathoracic abnormality; specifically, no pulmonary embolism, pneumonia, or pleural effusion. Aortic Atherosclerosis (ICD10-I70.0). Electronically Signed   By: Wallie Char M.D.   On: 12/03/2023 13:55   DG Chest 2 View Result Date: 12/03/2023 CLINICAL DATA:  Chest pain, shortness of breath, dizziness EXAM: CHEST - 2 VIEW COMPARISON:  08/11/2015 FINDINGS: Cardiomediastinal silhouette and pulmonary vasculature are within normal limits. Lungs are clear. IMPRESSION: No acute cardiopulmonary process. Electronically Signed   By: Acquanetta Belling M.D.   On: 12/03/2023 11:50   Data Reviewed: Relevant notes from primary care and specialist visits, past discharge summaries as available in EHR, including Care  Everywhere. Prior diagnostic testing as pertinent to current admission diagnoses, Updated medications and problem lists for reconciliation ED course, including vitals, labs, imaging, treatment and response to treatment,Triage notes, nursing and pharmacy notes and ED provider's notes Notable results as noted in HPI.Discussed case with EDMD/ ED APP/ or Specialty MD on call and as needed.  Assessment & Plan  >> Sinus tachycardia: Differentials include dehydration, electrolyte related sepsis related. Initially patient received 1 L which I repeated and patient's electrolytes are stable.  Lactic acid ordered as patient's anion gap was elevated which has returned to normal we will order serum acetone and beta hydroxybutyrate although DKA is less likely.  Will order free T4 and TSH.  Will order 2D echocardiogram.  Additional workup once patient gets echo.  Cardiology consult as deemed appropriate. Will get blood cultures and urine cultures.  >> Diabetes mellitus type 2: Glycemic protocol and carb consistent diet.   >>LE Wounds : Wound care consult.   >>AKI: Lab Results  Component Value Date   CREATININE 1.50 (H) 12/03/2023   CREATININE 1.41 (H) 12/03/2023   CREATININE 1.23 (H) 08/12/2023  Monitor. Cont with MIVF. Hold metformin and jardiance.    DVT prophylaxis:  Heparin  Consults:  None  Advance Care Planning:    Code Status: Full Code   Family Communication:  None  Disposition Plan:  Home.  Severity of Illness: The appropriate patient status for this  patient is OBSERVATION. Observation status is judged to be reasonable and necessary in order to provide the required intensity of service to ensure the patient's safety. The patient's presenting symptoms, physical exam findings, and initial radiographic and laboratory data in the context of their medical condition is felt to place them at decreased risk for further clinical deterioration. Furthermore, it is anticipated that the patient  will be medically stable for discharge from the hospital within 2 midnights of admission.   Unresulted Labs (From admission, onward)     Start     Ordered   12/04/23 0500  Comprehensive metabolic panel  Tomorrow morning,   R        12/03/23 1638   12/04/23 0500  CBC  Tomorrow morning,   R        12/03/23 1638   12/03/23 1955  Beta-hydroxybutyric acid  Add-on,   AD        12/03/23 1954   12/03/23 1953  Urinalysis, Routine w reflex microscopic -Urine, Unspecified Source  Once,   R       Question:  Specimen Source  Answer:  Urine, Unspecified Source   12/03/23 1952   12/03/23 1629  HIV Antibody (routine testing w rflx)  (HIV Antibody (Routine testing w reflex) panel)  Once,   R        12/03/23 1638   12/03/23 1621  Parathyroid hormone, intact (no Ca)  Once,   URGENT        12/03/23 1620   12/03/23 1200  Blood culture (routine x 2)  BLOOD CULTURE X 2,   R      12/03/23 1200            Orders Placed This Encounter  Procedures   Resp panel by RT-PCR (RSV, Flu A&B, Covid) Anterior Nasal Swab   Blood culture (routine x 2)   DG Chest 2 View   CT Angio Chest PE W and/or Wo Contrast   DG Tibia/Fibula Right   CBC   Comprehensive metabolic panel   Brain natriuretic peptide   Magnesium   Parathyroid hormone, intact (no Ca)   Hemoglobin A1c   TSH   T4, free   HIV Antibody (routine testing w rflx)   Comprehensive metabolic panel   CBC   Urinalysis, Routine w reflex microscopic -Urine, Unspecified Source   Beta-hydroxybutyric acid   Diet Heart Room service appropriate? Yes; Fluid consistency: Thin   Document Height and Actual Weight   ED Cardiac monitoring   Initiate Carrier Fluid Protocol   Apply Diabetes Mellitus Care Plan   STAT CBG when hypoglycemia is suspected. If treated, recheck every 15 minutes after each treatment until CBG >/= 70 mg/dl   Refer to Hypoglycemia Protocol Sidebar Report for treatment of CBG < 70 mg/dl   No HS correction Insulin   Maintain IV access    Vital signs   Notify physician (specify)   Mobility Protocol: No Restrictions RN to initiate protocols based on patient's level of care   Refer to Sidebar Report Refer to ICU, Med-Surg, Progressive, and Step-Down Mobility Protocol Sidebars   Initiate Adult Central Line Maintenance and Catheter Protocol for patients with central line (CVC, PICC, Port, Hemodialysis, Trialysis)   Daily weights   Intake and Output   Do not place and if present remove PureWick   Initiate Oral Care Protocol   Initiate Carrier Fluid Protocol   RN may order General Admission PRN Orders utilizing "General Admission PRN medications" (through manage orders) for the  following patient needs: allergy symptoms (Claritin), cold sores (Carmex), cough (Robitussin DM), eye irritation (Liquifilm Tears), hemorrhoids (Tucks), indigestion (Maalox), minor skin irritation (Hydrocortisone Cream), muscle pain Lovena Rubinstein Gay), nose irritation (saline nasal spray) and sore throat (Chloraseptic spray).   Cardiac Monitoring - Continuous Indefinite   Full code   Consult for [facility] Admission   Pulse oximetry check with vital signs   Oxygen therapy Mode or (Route): Nasal cannula; Liters Per Minute: 2; Keep O2 saturation between: greater than 92 %   I-stat chem 8, ED (not at [facility], [facility] or [facility])   I-Stat CG4 Lactic Acid   CBG monitoring, ED   ED EKG   EKG 12-Lead   ECHOCARDIOGRAM COMPLETE   Insert peripheral IV   Insert peripheral IV   Place in observation (patient's expected length of stay will be less than 2 midnights)   Aspiration precautions   Fall precautions    Author: Lavanda Porter, MD 12 pm -8 pm. 12/03/2023 8:00 PM >>Please note for any concern,or critical results after hours past 8pm please contact the [facility] hospitalist [facility] floor coverage provider from 7 PM- 7 AM. For on call review [email or web address], username [email or web address] and PW: your phone number<<

## 2023-12-03 NOTE — ED Notes (Signed)
 Pt states her she doesn't have pain but she feels off. She isn't sure what is going on. Pt just doesn't feel like her self.

## 2023-12-03 NOTE — ED Notes (Signed)
Patient vomited x1

## 2023-12-03 NOTE — ED Notes (Signed)
 Pt states her protein has increased which has cause constipation. Pt last BM 11/30/2023

## 2023-12-03 NOTE — ED Notes (Signed)
 Patient transported to CT

## 2023-12-03 NOTE — ED Notes (Signed)
RN notified phlebotomy to collect lab

## 2023-12-03 NOTE — ED Notes (Signed)
 RN attempted to collect lab work without success. Phlebotomy notified.

## 2023-12-03 NOTE — ED Triage Notes (Signed)
 Pt sent by wound care specialist for tachycardia. Pt c/o SOB, dizziness and left sided chest painx2d.

## 2023-12-03 NOTE — ED Provider Notes (Signed)
  EMERGENCY DEPARTMENT AT [facility] Provider Note   CSN: [id number] Arrival date & time: 12/03/23  0915     History  Chief Complaint  Patient presents with   Tachycardia   Shortness of Breath   Weakness    Holly Spencer is a 63 y.o. female.   Shortness of Breath Weakness Associated symptoms: shortness of breath      Patient has history of diabetes hypertension and a chronic wound on her right lower extremity.  Patient states she started having some symptoms of the weekend of feeling a little bit short of breath and lightheaded.  She then started experiencing pain in her left chest since yesterday.  It has been aching discomfort in her chest.  Patient denies any history of heart or lung disease.  She denies any coughing.  No leg swelling.  No fevers or chills  Home Medications Prior to Admission medications   Medication Sig Start Date End Date Taking? Authorizing Provider  amLODipine (NORVASC) 5 MG tablet TAKE 1 TABLET (5 MG TOTAL) DAILY BY MOUTH. 06/15/18   Shade Flood, MD  Blood Glucose Monitoring Suppl (BLOOD GLUCOSE METER) kit Use as instructed 05/25/12   Valarie Cones, Dema Severin, PA-C  FINGERSTIX LANCETS MISC To check blood sugar tid/ 250.00 dx code uncontrolled diabetes 08/07/13   Elvina Sidle, MD  glipiZIDE (GLUCOTROL) 5 MG tablet TAKE 1 TABLET (5 MG TOTAL) 2 (TWO) TIMES DAILY BEFORE A MEAL BY MOUTH. 10/06/18   Shade Flood, MD  Homeopathic Products [facility] ALLERGY RELIEF NA) Place 1 spray into the nose 2 (two) times daily as needed (allergies).    [provider]  JARDIANCE 10 MG TABS tablet Take 1 tablet by mouth daily. 07/23/22   [provider]  metFORMIN (GLUCOPHAGE-XR) 750 MG 24 hr tablet TAKE 1 TABLET (750 MG TOTAL) DAILY BY MOUTH. 03/09/18   Shade Flood, MD  valsartan-hydrochlorothiazide (DIOVAN-HCT) 320-12.5 MG tablet TAKE 1 TABLET BY MOUTH EVERY DAY 06/15/18   Shade Flood, MD      Allergies    Patient has  no known allergies.    Review of Systems   Review of Systems  Respiratory:  Positive for shortness of breath.   Neurological:  Positive for weakness.    Physical Exam Updated Vital Signs BP (!) 186/97   Pulse 99   Temp 98.3 F (36.8 C) (Oral)   Resp 13   Ht 1.626 m (5\' 4" )   Wt 89.8 kg   SpO2 93%   BMI 33.98 kg/m  Physical Exam Vitals and nursing note reviewed.  Constitutional:      General: She is not in acute distress.    Appearance: She is well-developed.  HENT:     Head: Normocephalic and atraumatic.     Right Ear: External ear normal.     Left Ear: External ear normal.  Eyes:     General: No scleral icterus.       Right eye: No discharge.        Left eye: No discharge.     Conjunctiva/sclera: Conjunctivae normal.  Neck:     Trachea: No tracheal deviation.  Cardiovascular:     Rate and Rhythm: Normal rate and regular rhythm.  Pulmonary:     Effort: Pulmonary effort is normal. No respiratory distress.     Breath sounds: Normal breath sounds. No stridor. No wheezing or rales.  Abdominal:     General: Bowel sounds are normal. There is no distension.  Palpations: Abdomen is soft.     Tenderness: There is no abdominal tenderness. There is no guarding or rebound.  Musculoskeletal:        General: No tenderness or deformity.     Cervical back: Neck supple.     Comments: Dressing removed from patient's lower extremity wound.  There is ulcerative lesion anterior shin that has a gentian violet dressing covering it.  Patient does not have any surrounding erythema or lymphangitic streaking  Skin:    General: Skin is warm and dry.     Findings: No rash.  Neurological:     General: No focal deficit present.     Mental Status: She is alert.     Cranial Nerves: No cranial nerve deficit, dysarthria or facial asymmetry.     Sensory: No sensory deficit.     Motor: No abnormal muscle tone or seizure activity.     Coordination: Coordination normal.  Psychiatric:         Mood and Affect: Mood normal.     ED Results / Procedures / Treatments   Labs (all labs ordered are listed, but only abnormal results are displayed) Labs Reviewed  CBC - Abnormal; Notable for the following components:      Result Value   WBC 16.8 (*)    RBC 5.32 (*)    Hemoglobin 16.5 (*)    HCT 49.4 (*)    All other components within normal limits  COMPREHENSIVE METABOLIC PANEL WITH GFR - Abnormal; Notable for the following components:   CO2 21 (*)    Glucose, Bld 194 (*)    BUN 35 (*)    Creatinine, Ser 1.41 (*)    Calcium 10.9 (*)    Total Protein 8.7 (*)    GFR, Estimated 42 (*)    Anion gap 18 (*)    All other components within normal limits  I-STAT CHEM 8, ED - Abnormal; Notable for the following components:   BUN 38 (*)    Creatinine, Ser 1.50 (*)    Glucose, Bld 197 (*)    Calcium, Ion 1.08 (*)    Hemoglobin 16.0 (*)    HCT 47.0 (*)    All other components within normal limits  TROPONIN I (HIGH SENSITIVITY) - Abnormal; Notable for the following components:   Troponin I (High Sensitivity) 21 (*)    All other components within normal limits  TROPONIN I (HIGH SENSITIVITY) - Abnormal; Notable for the following components:   Troponin I (High Sensitivity) 18 (*)    All other components within normal limits  RESP PANEL BY RT-PCR (RSV, FLU A&B, COVID)  [id number]  CULTURE, BLOOD (ROUTINE X 2)  CULTURE, BLOOD (ROUTINE X 2)  BRAIN NATRIURETIC PEPTIDE  I-STAT CG4 LACTIC ACID, ED  I-STAT CG4 LACTIC ACID, ED    EKG EKG Interpretation Date/Time:  Wednesday December 03 2023 09:34:46 EDT Ventricular Rate:  96 PR Interval:  132 QRS Duration:  134 QT Interval:  380 QTC Calculation: 480 R Axis:   -34  Text Interpretation: Normal sinus rhythm Right atrial enlargement Left axis deviation Right bundle branch block Abnormal ECG When compared with ECG of 31-Oct-2014 00:28, Right bundle branch block is new since tracing in 2016 Confirmed by Trish Furl [phone]) on 12/03/2023 9:50:41  AM  Radiology CT Angio Chest PE W and/or Wo Contrast Result Date: 12/03/2023 CLINICAL DATA:  Pulmonary embolism (PE) suspected, high prob, dizziness, chest pain, dyspnea, tachycardia. EXAM: CT ANGIOGRAPHY CHEST WITH CONTRAST TECHNIQUE: Multidetector CT imaging of the  chest was performed using the standard protocol during bolus administration of intravenous contrast. Multiplanar CT image reconstructions and MIPs were obtained to evaluate the vascular anatomy. RADIATION DOSE REDUCTION: This exam was performed according to the departmental dose-optimization program which includes automated exposure control, adjustment of the mA and/or kV according to patient size and/or use of iterative reconstruction technique. CONTRAST:  75mL OMNIPAQUE IOHEXOL 350 MG/ML SOLN COMPARISON:  December 03, 2023, August 11, 2015 FINDINGS: Pulmonary Embolism: No pulmonary embolism. Apparent filling defect within a segmental branch in the anterior right upper lobe (axial 53), likely artifactual due to the dense contrast column in the SVC. Cardiovascular: No cardiomegaly or pericardial effusion. No aortic aneurysm. Dense multi-vessel coronary atherosclerosis. Focal mild-to-moderate stenosis of the proximal left subclavian artery from noncalcified plaque (axial 26). Normal variant 2 vessel aortic arch anatomy. Scattered aortic atherosclerosis. Mediastinum/Nodes: No mediastinal mass. No mediastinal, hilar, or axillary lymphadenopathy. Lungs/Pleura: The midline trachea and bronchi are patent. No focal airspace consolidation, pleural effusion, or pneumothorax. Posterior bibasilar dependent atelectasis. 2 mm micronodule abutting the horizontal fissure, likely an intrafissural lymph node. Musculoskeletal: No acute fracture or destructive bone lesion. Multilevel degenerative disc disease of the spine. 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 Segmental absence of the lateral right fifth  rib. Bony calculus surrounding the proximal right humeral neck. Upper Abdomen: No acute abnormality in the partially visualized upper abdomen. Nodular thickening of both adrenal glands. Review of the MIP images confirms the above findings. IMPRESSION: No acute intrathoracic abnormality; specifically, no pulmonary embolism, pneumonia, or pleural effusion. Aortic Atherosclerosis (ICD10-I70.0). Electronically Signed   By: Rance Burrows M.D.   On: 12/03/2023 13:55   DG Chest 2 View Result Date: 12/03/2023 CLINICAL DATA:  Chest pain, shortness of breath, dizziness EXAM: CHEST - 2 VIEW COMPARISON:  08/11/2015 FINDINGS: Cardiomediastinal silhouette and pulmonary vasculature are within normal limits. Lungs are clear. IMPRESSION: No acute cardiopulmonary process. Electronically Signed   By: Elester Grim M.D.   On: 12/03/2023 11:50    Procedures Procedures    Medications Ordered in ED Medications  aspirin chewable tablet 324 mg (324 mg Oral Given 12/03/23 1021)  iohexol (OMNIPAQUE) 350 MG/ML injection 75 mL (75 mLs Intravenous Contrast Given 12/03/23 1148)    ED Course/ Medical Decision Making/ A&P Clinical Course as of 12/03/23 1534  Wed Dec 03, 2023  1108 CBC(!) CBC does show leukocytosis. [JK]  1108 Chest x-ray without pneumonia on preliminary review [JK]  1150 Troponin I (High Sensitivity)(!) Initial troponin increased [JK]  1317 Resp panel by RT-PCR (RSV, Flu A&B, Covid) Anterior Nasal Swab COVID flu RSV negative. [JK]  1505 CT scan does not show any evidence of pulmonary embolism [JK]  1506 Troponin is slightly elevated.  Lactic acid level normal. [JK]  1532 Case discussed with Dr. Lydia Sams regarding admission [JK]    Clinical Course User Index [JK] Trish Furl, MD                                 Medical Decision Making Problems Addressed: Dyspnea, unspecified type: acute illness or injury that poses a threat to life or bodily functions Hypertension, unspecified type: acute illness or  injury that poses a threat to life or bodily functions  Amount and/or Complexity of Data Reviewed Labs: ordered. Decision-making details documented in ED Course. Radiology: ordered and independent interpretation performed.  Risk OTC drugs. Prescription drug management. Decision  regarding hospitalization.   Patient presented to the ED for evaluation of tachycardia shortness of breath.  Patient also reported some recent chest pain.  ED workup notable for elevated white blood cell count.  Patient also has evidence of chronic kidney disease but no acute changes.  No signs of pneumonia on her x-ray.  Patient's troponins were slightly elevated.  No definite signs of ACS but the elevated troponins are concerned.  Is also concerned about the possibility of pulmonary embolism.  CT angiogram does not show PE or other acute abnormality.  Patient however does remain tachycardic.  With her hypertension and elevated troponin I think she would benefit from further workup, possible echocardiogram.  I will consult the medical service for admission.        Final Clinical Impression(s) / ED Diagnoses Final diagnoses:  Dyspnea, unspecified type  Hypertension, unspecified type    Rx / DC Orders ED Discharge Orders     None         Trish Furl, MD 12/03/23 1534

## 2023-12-03 NOTE — ED Notes (Signed)
 Nt called CCMD to put pt on monitor

## 2023-12-03 NOTE — ED Notes (Signed)
 Pt states she hasn't taken her medication d/t being nauseous and vomiting.  Pt states she lost count on how many times she has vomited.

## 2023-12-03 NOTE — ED Notes (Signed)
 Patient transported to X-ray

## 2023-12-04 ENCOUNTER — Observation Stay (HOSPITAL_BASED_OUTPATIENT_CLINIC_OR_DEPARTMENT_OTHER)

## 2023-12-04 DIAGNOSIS — R000 Tachycardia, unspecified: Secondary | ICD-10-CM | POA: Diagnosis not present

## 2023-12-04 DIAGNOSIS — R9431 Abnormal electrocardiogram [ECG] [EKG]: Secondary | ICD-10-CM | POA: Diagnosis not present

## 2023-12-04 LAB — ECHOCARDIOGRAM COMPLETE
Area-P 1/2: 2.64 cm2
Height: 64 in
S' Lateral: 2.4 cm
Weight: 3167.57 [oz_av]

## 2023-12-04 LAB — COMPREHENSIVE METABOLIC PANEL WITH GFR
ALT: 22 U/L (ref 0–44)
AST: 24 U/L (ref 15–41)
Albumin: 4 g/dL (ref 3.5–5.0)
Alkaline Phosphatase: 99 U/L (ref 38–126)
Anion gap: 15 (ref 5–15)
BUN: 33 mg/dL — ABNORMAL HIGH (ref 8–23)
CO2: 26 mmol/L (ref 22–32)
Calcium: 10 mg/dL (ref 8.9–10.3)
Chloride: 93 mmol/L — ABNORMAL LOW (ref 98–111)
Creatinine, Ser: 1.06 mg/dL — ABNORMAL HIGH (ref 0.44–1.00)
GFR, Estimated: 60 mL/min — ABNORMAL LOW (ref >60)
Glucose, Bld: 122 mg/dL — ABNORMAL HIGH (ref 70–99)
Potassium: 4 mmol/L (ref 3.5–5.1)
Sodium: 134 mmol/L — ABNORMAL LOW (ref 135–145)
Total Bilirubin: 1.2 mg/dL (ref 0.0–1.2)
Total Protein: 8 g/dL (ref 6.5–8.1)

## 2023-12-04 LAB — CBG MONITORING, ED: Glucose-Capillary: 198 mg/dL — ABNORMAL HIGH (ref 70–99)

## 2023-12-04 LAB — BETA-HYDROXYBUTYRIC ACID: Beta-Hydroxybutyric Acid: 0.95 mmol/L — ABNORMAL HIGH (ref 0.05–0.27)

## 2023-12-04 LAB — CBC
HCT: 48.5 % — ABNORMAL HIGH (ref 36.0–46.0)
Hemoglobin: 16.2 g/dL — ABNORMAL HIGH (ref 12.0–15.0)
MCH: 30.5 pg (ref 26.0–34.0)
MCHC: 33.4 g/dL (ref 30.0–36.0)
MCV: 91.3 fL (ref 80.0–100.0)
Platelets: 291 10*3/uL (ref 150–400)
RBC: 5.31 MIL/uL — ABNORMAL HIGH (ref 3.87–5.11)
RDW: 12.8 % (ref 11.5–15.5)
WBC: 15.5 10*3/uL — ABNORMAL HIGH (ref 4.0–10.5)
nRBC: 0 % (ref 0.0–0.2)

## 2023-12-04 LAB — GLUCOSE, CAPILLARY
Glucose-Capillary: 153 mg/dL — ABNORMAL HIGH (ref 70–99)
Glucose-Capillary: 130 mg/dL — ABNORMAL HIGH (ref 70–99)
Glucose-Capillary: 146 mg/dL — ABNORMAL HIGH (ref 70–99)

## 2023-12-04 LAB — PARATHYROID HORMONE, INTACT (NO CA): PTH: 37 pg/mL (ref 15–65)

## 2023-12-04 MED ORDER — SODIUM CHLORIDE 0.9 % IV SOLN
12.5000 mg | Freq: Once | INTRAVENOUS | Status: AC
  Administered 2023-12-04: 12.5 mg via INTRAVENOUS
  Filled 2023-12-04 (×2): qty 0.5

## 2023-12-04 MED ORDER — ONDANSETRON HCL 4 MG/2ML IJ SOLN: 4.0000 mg | Freq: Four times a day (QID) | INTRAMUSCULAR | Status: DC | PRN

## 2023-12-04 MED ORDER — HYDROCHLOROTHIAZIDE 12.5 MG PO TABS
12.5000 mg | ORAL_TABLET | Freq: Every day | ORAL | Status: DC
  Administered 2023-12-05: 12.5 mg via ORAL
  Filled 2023-12-04: qty 1

## 2023-12-04 MED ORDER — HYDRALAZINE HCL 20 MG/ML IJ SOLN: 10.0000 mg | Freq: Four times a day (QID) | INTRAMUSCULAR | Status: DC | PRN

## 2023-12-04 MED ORDER — TRIAMCINOLONE ACETONIDE 0.1 % EX CREA: 1.0000 | TOPICAL_CREAM | Freq: Every day | CUTANEOUS | Status: DC | PRN

## 2023-12-04 MED ORDER — VALSARTAN-HYDROCHLOROTHIAZIDE 320-12.5 MG PO TABS: 1.0000 | ORAL_TABLET | Freq: Every day | ORAL | Status: DC

## 2023-12-04 MED ORDER — SODIUM CHLORIDE 0.9 % IV BOLUS
1000.0000 mL | Freq: Once | INTRAVENOUS | Status: AC
  Administered 2023-12-05: 1000 mL via INTRAVENOUS

## 2023-12-04 MED ORDER — IRBESARTAN 300 MG PO TABS
300.0000 mg | ORAL_TABLET | Freq: Every day | ORAL | Status: DC
  Administered 2023-12-05: 300 mg via ORAL
  Filled 2023-12-04: qty 1

## 2023-12-04 MED ORDER — AMLODIPINE BESYLATE 10 MG PO TABS
10.0000 mg | ORAL_TABLET | Freq: Every day | ORAL | Status: DC
  Administered 2023-12-04 – 2023-12-05 (×2): 10 mg via ORAL
  Filled 2023-12-04: qty 2
  Filled 2023-12-04: qty 1

## 2023-12-04 NOTE — Progress Notes (Signed)
 PROGRESS NOTE                                                                                                                                                                                                             Patient Demographics:    Holly Spencer, is a 63 y.o. female, DOB - [birth year], ZOX:[id number]  Outpatient Primary MD for the patient is Levan Hurst Anastasia Fiedler, FNP    LOS - 0  Admit date - 12/03/2023    Chief Complaint  Patient presents with   Tachycardia   Shortness of Breath   Weakness       Brief Narrative (HPI from H&P)   Holly Spencer is a 63 y.o. female with past medical history  of morbid obesity with a BMI of 33.98, diabetes mellitus type 2, essential hypertension, hyperlipidemia, CKD stage IIIb presenting today to the emergency room sent from the wound clinic for generalized weakness shortness of breath and tachycardia.  Patient also reported dizziness along with her shortness of breath and chest pain for the past 2 days.  No reports of dizziness NSAID use bleeding falls nausea vomiting diarrhea.    Subjective:    Holly Spencer was evaluated at the bed side.  Patient evaluated laying down slightly nauseous.  She reports feeling better and denies any shortness of breath, dizziness, chest pain.   Assessment  & Plan :    Assessment and Plan:  # Sinus tach, resolved Presented from home clinic with sinus tach and some shortness of breath. Resolved after IV fluid hydration with points to possible dehydration as a cause of her sinus tach.  HR now stable in the 70s to 80s.  Echocardiogram within normal limits. Normal TSH and electrolytes. -Continue Coreg -Continue close monitoring on telemetry  # T2DM Admission labs show early DKA versus euglycemic DKA. Acidosis anion gap resolved with IV fluids.  CBGs stable in the 130s to 150s. - SSI with meals with CBG monitoring - Continue to hold home  Jardiance and metformin  # HTN BP remains persistently elevated with SBP in the 140s to 170s. -Continue on Coreg and resume amlodipine -Resume valsartan-HCTZ with resolution of AKI  # Chronic ulcer of the right calf - Follow-up for wound care in the outpatient - Continue as needed triamcinolone cream  # Generalized weakness - PT/OT eval  Nutrition Problem:        Obesity: Estimated body mass index is 33.98 kg/m as calculated from the following:   Height as of this encounter: 5\' 4"  (1.626 m).   Weight as of this encounter: 89.8 kg.         Condition -stable  Family Communication  : No family at bedside  Code Status : Full  Consults  : None  PUD Prophylaxis : None   Procedures  :     None      Disposition Plan  :    Status is: Observation The patient remains OBS appropriate and will d/c before 2 midnights.  DVT Prophylaxis  :    heparin injection 5,000 Units Start: 12/03/23 1645     Lab Results  Component Value Date   PLT 313 12/03/2023    Diet :  Diet Order             Diet Heart Room service appropriate? Yes; Fluid consistency: Thin  Diet effective now                    Inpatient Medications  Scheduled Meds:  amLODipine  10 mg Oral Daily   carvedilol  6.25 mg Oral BID WC   heparin  5,000 Units Subcutaneous Q8H   insulin aspart  0-15 Units Subcutaneous TID WC   sodium chloride flush  3 mL Intravenous Q12H   sodium chloride flush  3-10 mL Intravenous Q12H   Continuous Infusions:  promethazine (PHENERGAN) injection (IM or IVPB)     PRN Meds:.acetaminophen **OR** acetaminophen, HYDROcodone-acetaminophen, morphine injection, sodium chloride flush  Antibiotics  :    Anti-infectives (From admission, onward)    None         Objective:   Vitals:   12/04/23 1030 12/04/23 1045 12/04/23 1100 12/04/23 1115  BP:      Pulse: 87 79 83 79  Resp: 12 18 11 16   Temp:      TempSrc:      SpO2: 95% 97% 96% 96%  Weight:       Height:        Wt Readings from Last 3 Encounters:  12/03/23 89.8 kg  08/12/23 89.8 kg  08/06/23 89.8 kg     Intake/Output Summary (Last 24 hours) at 12/04/2023 1225 Last data filed at 12/03/2023 2045 Gross per 24 hour  Intake 64.36 ml  Output --  Net 64.36 ml     Physical Exam  General: Pleasant, well-appearing woman laying in bed. No acute distress. HEENT: [place]/AT. Anicteric sclera.  Dry mucous membrane CV: RRR. No murmurs, rubs, or gallops. No LE edema Pulmonary: Lungs CTAB. Normal effort. No wheezing or rales. Abdominal: Soft, NT/ND. Normal bowel sounds. Extremities: Palpable radial and DP pulses. Normal ROM. Skin: Warm and dry. Chronic right calf ulcer covered with dressing Neuro: A&Ox3. Moves all extremities. Normal sensation to light touch. No focal deficit. Psych: Normal mood and affect    RN pressure injury documentation:      Data Review:    Recent Labs  Lab 12/03/23 1023 12/03/23 1033  WBC 16.8*  --   HGB 16.5* 16.0*  HCT 49.4* 47.0*  PLT 313  --   MCV 92.9  --   MCH 31.0  --   MCHC 33.4  --   RDW 13.4  --     Recent Labs  Lab 12/03/23 1023 12/03/23 1033 12/03/23 1420 12/03/23 1827 12/03/23 1846  NA 138 138  --   --   --  K 3.6 3.6  --   --   --   CL 99 105  --   --   --   CO2 21*  --   --   --   --   ANIONGAP 18*  --   --   --   --   GLUCOSE 194* 197*  --   --   --   BUN 35* 38*  --   --   --   CREATININE 1.41* 1.50*  --   --   --   AST 22  --   --   --   --   ALT 26  --   --   --   --   ALKPHOS 108  --   --   --   --   BILITOT 0.9  --   --   --   --   ALBUMIN 4.6  --   --   --   --   LATICACIDVEN  --   --  1.0  --  1.5  TSH  --   --   --  0.486  --   HGBA1C  --   --   --  6.2*  --   BNP  --   --   --  114.6*  --   MG  --   --   --  2.5*  --   CALCIUM 10.9*  --   --   --   --       Recent Labs  Lab 12/03/23 1023 12/03/23 1420 12/03/23 1827 12/03/23 1846  LATICACIDVEN  --  1.0  --  1.5  TSH  --   --  0.486  --   HGBA1C   --   --  6.2*  --   BNP  --   --  114.6*  --   MG  --   --  2.5*  --   CALCIUM 10.9*  --   --   --     --------------------------------------------------------------------------------------------------------------- Lab Results  Component Value Date   CHOL 251 (H) 06/30/2017   HDL 45 06/30/2017   LDLCALC 162 (H) 06/30/2017   TRIG 219 (H) 06/30/2017   CHOLHDL 5.6 (H) 06/30/2017    Lab Results  Component Value Date   HGBA1C 6.2 (H) 12/03/2023   Recent Labs    12/03/23 1827  TSH 0.486  FREET4 0.89   No results for input(s): "VITAMINB12", "FOLATE", "FERRITIN", "TIBC", "IRON", "RETICCTPCT" in the last 72 hours. ------------------------------------------------------------------------------------------------------------------ Cardiac Enzymes No results for input(s): "CKMB", "TROPONINI", "MYOGLOBIN" in the last 168 hours.  Invalid input(s): "CK"  Micro Results Recent Results (from the past 240 hours)  Resp panel by RT-PCR (RSV, Flu A&B, Covid) Anterior Nasal Swab     Status: None   Collection Time: 12/03/23 11:54 AM   Specimen: Anterior Nasal Swab  Result Value Ref Range Status   SARS Coronavirus 2 by RT PCR NEGATIVE NEGATIVE Final   Influenza A by PCR NEGATIVE NEGATIVE Final   Influenza B by PCR NEGATIVE NEGATIVE Final    Comment: (NOTE) The Xpert Xpress SARS-CoV-2/FLU/RSV plus assay is intended as an aid in the diagnosis of influenza from Nasopharyngeal swab specimens and should not be used as a sole basis for treatment. Nasal washings and aspirates are unacceptable for Xpert Xpress SARS-CoV-2/FLU/RSV testing.  Fact Sheet for Patients: BloggerCourse.com  Fact Sheet for Healthcare Providers: SeriousBroker.it  This test is not yet approved or cleared by the [place]  FDA  and has been authorized for detection and/or diagnosis of SARS-CoV-2 by FDA under an Emergency Use Authorization (EUA). This EUA will remain in  effect (meaning this test can be used) for the duration of the COVID-19 declaration under Section 564(b)(1) of the Act, 21 U.S.C. section 360bbb-3(b)(1), unless the authorization is terminated or revoked.     Resp Syncytial Virus by PCR NEGATIVE NEGATIVE Final    Comment: (NOTE) Fact Sheet for Patients: BloggerCourse.com  Fact Sheet for Healthcare Providers: SeriousBroker.it  This test is not yet approved or cleared by the [place]  FDA and has been authorized for detection and/or diagnosis of SARS-CoV-2 by FDA under an Emergency Use Authorization (EUA). This EUA will remain in effect (meaning this test can be used) for the duration of the COVID-19 declaration under Section 564(b)(1) of the Act, 21 U.S.C. section 360bbb-3(b)(1), unless the authorization is terminated or revoked.  Performed at [facility], [address] [place], [place] [address]   Blood culture (routine x 2)     Status: None (Preliminary result)   Collection Time: 12/03/23 12:00 PM   Specimen: BLOOD RIGHT HAND  Result Value Ref Range Status   Specimen Description BLOOD RIGHT HAND  Final   Special Requests   Final    BOTTLES DRAWN AEROBIC AND ANAEROBIC Blood Culture adequate volume   Culture   Final    NO GROWTH < 24 HOURS Performed at [facility], [address] [place], [place] [address]    Report Status PENDING  Incomplete  Blood culture (routine x 2)     Status: None (Preliminary result)   Collection Time: 12/03/23 12:05 PM   Specimen: BLOOD LEFT ARM  Result Value Ref Range Status   Specimen Description BLOOD LEFT ARM  Final   Special Requests   Final    BOTTLES DRAWN AEROBIC AND ANAEROBIC Blood Culture adequate volume   Culture   Final    NO GROWTH < 24 HOURS Performed at [facility], [address], [place], [place] [address]    Report Status PENDING  Incomplete    Radiology Reports DG Tibia/Fibula Right Result Date:  12/03/2023 CLINICAL DATA:  Ulcerative wound anterior mid tibia EXAM: RIGHT TIBIA AND FIBULA - 2 VIEW COMPARISON:  None Available. FINDINGS: Degenerative changes in the right knee with joint space narrowing and spurring. No joint effusion. No acute bony abnormality. Specifically, no fracture, subluxation, or dislocation. No bone destruction, particularly in the mid tibial region. No soft tissue gas or radiopaque foreign body. IMPRESSION: No acute bony abnormality. Electronically Signed   By: Janeece Mechanic M.D.   On: 12/03/2023 17:10   CT Angio Chest PE W and/or Wo Contrast Result Date: 12/03/2023 CLINICAL DATA:  Pulmonary embolism (PE) suspected, high prob, dizziness, chest pain, dyspnea, tachycardia. EXAM: CT ANGIOGRAPHY CHEST WITH CONTRAST TECHNIQUE: Multidetector CT imaging of the chest was performed using the standard protocol during bolus administration of intravenous contrast. Multiplanar CT image reconstructions and MIPs were obtained to evaluate the vascular anatomy. RADIATION DOSE REDUCTION: This exam was performed according to the departmental dose-optimization program which includes automated exposure control, adjustment of the mA and/or kV according to patient size and/or use of iterative reconstruction technique. CONTRAST:  75mL OMNIPAQUE IOHEXOL 350 MG/ML SOLN COMPARISON:  December 03, 2023, August 11, 2015 FINDINGS: Pulmonary Embolism: No pulmonary embolism. Apparent filling defect within a segmental branch in the anterior right upper lobe (axial 53), likely artifactual due to the dense contrast column in the SVC.  Cardiovascular: No cardiomegaly or pericardial effusion. No aortic aneurysm. Dense multi-vessel coronary atherosclerosis. Focal mild-to-moderate stenosis of the proximal left subclavian artery from noncalcified plaque (axial 26). Normal variant 2 vessel aortic arch anatomy. Scattered aortic atherosclerosis. Mediastinum/Nodes: No mediastinal mass. No mediastinal, hilar, or axillary  lymphadenopathy. Lungs/Pleura: The midline trachea and bronchi are patent. No focal airspace consolidation, pleural effusion, or pneumothorax. Posterior bibasilar dependent atelectasis. 2 mm micronodule abutting the horizontal fissure, likely an intrafissural lymph node. Musculoskeletal: No acute fracture or destructive bone lesion. Multilevel degenerative disc disease of the spine. 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 Segmental absence of the lateral right fifth rib. Bony calculus surrounding the proximal right humeral neck. Upper Abdomen: No acute abnormality in the partially visualized upper abdomen. Nodular thickening of both adrenal glands. Review of the MIP images confirms the above findings. IMPRESSION: No acute intrathoracic abnormality; specifically, no pulmonary embolism, pneumonia, or pleural effusion. Aortic Atherosclerosis (ICD10-I70.0). Electronically Signed   By: Rance Burrows M.D.   On: 12/03/2023 13:55   DG Chest 2 View Result Date: 12/03/2023 CLINICAL DATA:  Chest pain, shortness of breath, dizziness EXAM: CHEST - 2 VIEW COMPARISON:  08/11/2015 FINDINGS: Cardiomediastinal silhouette and pulmonary vasculature are within normal limits. Lungs are clear. IMPRESSION: No acute cardiopulmonary process. Electronically Signed   By: Elester Grim M.D.   On: 12/03/2023 11:50      Signature  -   Vita Grip M.D on 12/04/2023 at 12:25 PM   -  To page go to [email or web address]

## 2023-12-04 NOTE — ED Notes (Signed)
 Labs that were added on are not in progress. Attempted lab draw from IV, unable. Morning labs still needed.

## 2023-12-04 NOTE — Plan of Care (Signed)

## 2023-12-04 NOTE — Progress Notes (Signed)
  Echocardiogram 2D Echocardiogram has been performed.  Fain Home RDCS 12/04/2023, 2:07 PM

## 2023-12-04 NOTE — ED Notes (Signed)
 Up to b/r, steady gait, requesting drink, given.

## 2023-12-04 NOTE — Progress Notes (Signed)
   12/04/23 1543  TOC Brief Assessment  Insurance and Status Reviewed [facility])  Patient has primary care physician Yes Abelardo Abernethy, Janet Medico, FNP)  Home environment has been reviewed from home  Prior level of function: independent  Prior/Current Home Services No current home services  Social Drivers of Health Review SDOH reviewed no interventions necessary  Readmission risk has been reviewed Yes (N/A listed)  Transition of care needs no transition of care needs at this time   Please place [facility] consult for any needs

## 2023-12-04 NOTE — Progress Notes (Signed)
 Patient transferred from ED to RM 5W 01. Patient is alert and oriented. Settled in bed. Vitals signes done. Patient oriented to room and the use of the call light.

## 2023-12-04 NOTE — ED Notes (Signed)
 Back from b/r, no changes, returned to monitor, VSS.

## 2023-12-04 NOTE — ED Notes (Signed)
 Up to b/r, steady gait. Admitting MD by to see, missed pt.

## 2023-12-05 ENCOUNTER — Other Ambulatory Visit (HOSPITAL_COMMUNITY): Payer: Self-pay

## 2023-12-05 DIAGNOSIS — I2510 Atherosclerotic heart disease of native coronary artery without angina pectoris: Secondary | ICD-10-CM | POA: Insufficient documentation

## 2023-12-05 DIAGNOSIS — R000 Tachycardia, unspecified: Secondary | ICD-10-CM | POA: Diagnosis not present

## 2023-12-05 LAB — GLUCOSE, CAPILLARY
Glucose-Capillary: 152 mg/dL — ABNORMAL HIGH (ref 70–99)
Glucose-Capillary: 143 mg/dL — ABNORMAL HIGH (ref 70–99)
Glucose-Capillary: 110 mg/dL — ABNORMAL HIGH (ref 70–99)

## 2023-12-05 LAB — CBC
HCT: 48.9 % — ABNORMAL HIGH (ref 36.0–46.0)
Hemoglobin: 16.1 g/dL — ABNORMAL HIGH (ref 12.0–15.0)
MCH: 30.7 pg (ref 26.0–34.0)
MCHC: 32.9 g/dL (ref 30.0–36.0)
MCV: 93.1 fL (ref 80.0–100.0)
Platelets: 283 10*3/uL (ref 150–400)
RBC: 5.25 MIL/uL — ABNORMAL HIGH (ref 3.87–5.11)
RDW: 12.7 % (ref 11.5–15.5)
WBC: 11.6 10*3/uL — ABNORMAL HIGH (ref 4.0–10.5)
nRBC: 0 % (ref 0.0–0.2)

## 2023-12-05 LAB — BASIC METABOLIC PANEL WITH GFR
Anion gap: 17 — ABNORMAL HIGH (ref 5–15)
BUN: 37 mg/dL — ABNORMAL HIGH (ref 8–23)
CO2: 22 mmol/L (ref 22–32)
Calcium: 9.1 mg/dL (ref 8.9–10.3)
Chloride: 95 mmol/L — ABNORMAL LOW (ref 98–111)
Creatinine, Ser: 1.16 mg/dL — ABNORMAL HIGH (ref 0.44–1.00)
GFR, Estimated: 54 mL/min — ABNORMAL LOW (ref >60)
Glucose, Bld: 118 mg/dL — ABNORMAL HIGH (ref 70–99)
Potassium: 3.6 mmol/L (ref 3.5–5.1)
Sodium: 134 mmol/L — ABNORMAL LOW (ref 135–145)

## 2023-12-05 LAB — MISC LABCORP TEST (SEND OUT): Labcorp test code: 83935

## 2023-12-05 MED ORDER — GLIPIZIDE 5 MG PO TABS
5.0000 mg | ORAL_TABLET | Freq: Two times a day (BID) | ORAL | Status: DC
  Administered 2023-12-05: 5 mg via ORAL
  Filled 2023-12-05 (×3): qty 1

## 2023-12-05 MED ORDER — CARVEDILOL 6.25 MG PO TABS
6.2500 mg | ORAL_TABLET | Freq: Two times a day (BID) | ORAL | 0 refills | Status: AC
  Filled 2023-12-05: qty 60, 30d supply, fill #0

## 2023-12-05 MED ORDER — POTASSIUM CHLORIDE CRYS ER 20 MEQ PO TBCR
40.0000 meq | EXTENDED_RELEASE_TABLET | Freq: Once | ORAL | Status: AC
  Administered 2023-12-05: 40 meq via ORAL
  Filled 2023-12-05: qty 2

## 2023-12-05 MED ORDER — ATORVASTATIN CALCIUM 20 MG PO TABS
20.0000 mg | ORAL_TABLET | Freq: Every day | ORAL | 0 refills | Status: AC
  Filled 2023-12-05: qty 30, 30d supply, fill #0

## 2023-12-05 MED ORDER — SODIUM CHLORIDE 0.9 % IV BOLUS: 500.0000 mL | Freq: Once | INTRAVENOUS | Status: DC

## 2023-12-05 MED ORDER — ASPIRIN 81 MG PO TBEC
81.0000 mg | DELAYED_RELEASE_TABLET | Freq: Every day | ORAL | 0 refills | Status: AC
  Filled 2023-12-05: qty 30, 30d supply, fill #0

## 2023-12-05 MED ORDER — SODIUM CHLORIDE 0.9 % IV BOLUS
500.0000 mL | Freq: Once | INTRAVENOUS | Status: AC
  Administered 2023-12-05: 500 mL via INTRAVENOUS

## 2023-12-05 NOTE — TOC Transition Note (Signed)
 Transition of Care [facility]) - Discharge Note   Patient Details  Name: Holly Spencer MRN: [id number] Date of Birth: 02/04/1961  Transition of Care [facility]) CM/SW Contact:  Eusebio High, RN Phone Number: 12/05/2023, 4:00 PM   Clinical Narrative:     Patient will dc to home. A rolling walker has been ordered and will be delivered bedside by [employer]. OPPT had been recommended and referral entered AVS updated. Family will transport at DC No additional TOC           Patient Goals and CMS Choice            Discharge Placement                       Discharge Plan and Services Additional resources added to the After Visit Summary for                                       Social Drivers of Health (SDOH) Interventions SDOH Screenings   Food Insecurity: No Food Insecurity (12/04/2023)  Housing: Low Risk  (12/04/2023)  Transportation Needs: No Transportation Needs (12/04/2023)  Utilities: Not At Risk (12/04/2023)  Social Connections: Socially Integrated (12/04/2023)  Tobacco Use: Low Risk  (12/03/2023)     Readmission Risk Interventions     No data to display

## 2023-12-05 NOTE — Progress Notes (Signed)
 Occupational Therapy Evaluation Patient Details Name: Holly Spencer MRN: [id number] DOB: 01/09/1961 Today's Date: 12/05/2023   History of Present Illness   63 y.o. female presenting 4/16 from the wound clinic for generalized weakness shortness of breath and tachycardia. PMHx: of morbid obesity with a BMI of 33.98, diabetes mellitus type 2, essential hypertension, hyperlipidemia, CKD stage IIIb.     Clinical Impressions Prior to admission patient was completely independent with adls/iadls, working and driving.  Today she presents near baseline and currently is independent to Supervision with adls.  She is near baseline for function with mild decreased endurance which should improve with general increased activity.  No further OT recommended at this time and patient/ son in agreement with discharge.      If plan is discharge home, recommend the following:   A little help with bathing/dressing/bathroom;Assistance with cooking/housework;Help with stairs or ramp for entrance     Functional Status Assessment   Patient has had a recent decline in their functional status and demonstrates the ability to make significant improvements in function in a reasonable and predictable amount of time.     Equipment Recommendations   None recommended by OT     Recommendations for Other Services         Precautions/Restrictions   Precautions Precautions: Fall Recall of Precautions/Restrictions: Intact Restrictions Weight Bearing Restrictions Per Provider Order: No     Mobility Bed Mobility               General bed mobility comments: in chair upon arrival    Transfers Overall transfer level: Needs assistance Equipment used: 1 person hand held assist Transfers: Sit to/from Stand Sit to Stand: Supervision           General transfer comment: Appears improved from earlier PT session      Balance Overall balance assessment: Needs assistance Sitting-balance  support: No upper extremity supported, Feet supported Sitting balance-Leahy Scale: Good Sitting balance - Comments: Able to touch toes in long sitting and in sitting with no difficulty       Standing balance comment: Patient able to stand feet together with EO and EC for 10 seconds with not difficulty and do single leg stance on each LE for 10 seconds and close supervision                           ADL either performed or assessed with clinical judgement   ADL Overall ADL's : Needs assistance/impaired (near baseline) Eating/Feeding: Independent   Grooming: Brushing hair;Wash/dry hands;Wash/dry face;Modified independent;Standing   Upper Body Bathing: Supervision/ safety   Lower Body Bathing: Supervison/ safety   Upper Body Dressing : Independent   Lower Body Dressing: Independent;Sit to/from stand   Toilet Transfer: Supervision/safety;Ambulation       Tub/ Shower Transfer: Supervision/safety   Functional mobility during ADLs: Supervision/safety (patient states she still feels a little off, but can do her adls) General ADL Comments: Patient is near baseline.  Supervision today without walker in room     Vision Baseline Vision/History: 0 No visual deficits Ability to See in Adequate Light: 0 Adequate Patient Visual Report: No change from baseline       Perception         Praxis         Pertinent Vitals/Pain Pain Assessment Pain Assessment: No/denies pain     Extremity/Trunk Assessment Upper Extremity Assessment Upper Extremity Assessment: Overall WFL for tasks assessed   Lower Extremity Assessment  Lower Extremity Assessment: Defer to PT evaluation   Cervical / Trunk Assessment Cervical / Trunk Assessment: Normal   Communication Communication Communication: No apparent difficulties   Cognition Arousal: Alert Behavior During Therapy: WFL for tasks assessed/performed                                 Following commands: Intact        Cueing  General Comments   Cueing Techniques: Verbal cues  At rest: SpO2 91%, HR 75, BP 159/88   Exercises     Shoulder Instructions      Home Living Family/patient expects to be discharged to:: Private residence Living Arrangements: Spouse/significant other Available Help at Discharge: Family;Available 24 hours/day Type of Home: House Home Access: Stairs to enter [employer] of Steps: 7 Entrance Stairs-Rails: Right;Left;Can reach both Home Layout: Bed/bath upstairs;Multi-level Alternate Level Stairs-Number of Steps: 8 Alternate Level Stairs-Rails: Right Bathroom Shower/Tub: Producer, Television/film/video: Handicapped height Bathroom Accessibility: Yes   Home Equipment: Grab bars - tub/shower;Shower seat          Prior Functioning/Environment Prior Level of Function : Independent/Modified Independent;Driving;Working/employed             Mobility Comments: ind no AD, denies falls ADLs Comments: Ind, 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    OT Problem List: Impaired balance (sitting and/or standing)   OT Treatment/Interventions:        OT Goals(Current goals can be found in the care plan section)       OT Frequency:       Co-evaluation              AM-PAC OT 6 Clicks Daily Activity     Outcome Measure Help from another person eating meals?: None Help from another person taking care of personal grooming?: None Help from another person toileting, which includes using toliet, bedpan, or urinal?: A Little Help from another person bathing (including washing, rinsing, drying)?: A Little Help from another person to put on and taking off regular upper body clothing?: A Little Help from another person to put on and taking off regular lower body clothing?: A Little 6 Click Score: 20   End of Session Nurse Communication: Mobility status  Activity Tolerance: Patient tolerated treatment well Patient left: in chair;with call  bell/phone within reach;with chair alarm set  OT Visit Diagnosis: Unsteadiness on feet (R26.81)                Time: 8975-8958 OT Time Calculation (min): 17 min Charges:  OT General Charges $OT Visit: 1 Visit OT Evaluation $OT Eval Low Complexity: 1 Low  Holly Spencer OTR/L   Holly Spencer 12/05/2023, 11:58 AM

## 2023-12-05 NOTE — TOC Progression Note (Signed)
 Transition of Care [facility]) - Progression Note    Patient Details  Name: Holly Spencer MRN: [id number] Date of Birth: [birth year]  Transition of Care [facility]) CM/SW Contact  Eusebio High, RN Phone Number: 12/05/2023, 1:02 PM  Clinical Narrative:     Patient will dc to home. A rolling walker has been ordered and will be delivered bedside by [employer].  OPPT had been recommended and referral entered  AVS updated. Family will transport at DC  No additional [facility]          Expected Discharge Plan and Services                                               Social Determinants of Health (SDOH) Interventions SDOH Screenings   Food Insecurity: No Food Insecurity (12/04/2023)  Housing: Low Risk  (12/04/2023)  Transportation Needs: No Transportation Needs (12/04/2023)  Utilities: Not At Risk (12/04/2023)  Social Connections: Socially Integrated (12/04/2023)  Tobacco Use: Low Risk  (12/03/2023)    Readmission Risk Interventions     No data to display

## 2023-12-05 NOTE — Discharge Instructions (Signed)
 Follow with Primary MD Abelardo Abernethy Janet Medico, FNP in 7 days   Get CBC, CMP, checked  by Primary MD next visit.    Activity: As tolerated with Full fall precautions use walker/cane & assistance as needed   Disposition Home    Diet: Heart Healthy /carb modied   On your next visit with your primary care physician please Get Medicines reviewed and adjusted.   Please request your Prim.MD to go over all Hospital Tests and Procedure/Radiological results at the follow up, please get all Hospital records sent to your Prim MD by signing hospital release before you go home.   If you experience worsening of your admission symptoms, develop shortness of breath, life threatening emergency, suicidal or homicidal thoughts you must seek medical attention immediately by calling 911 or calling your MD immediately  if symptoms less severe.  You Must read complete instructions/literature along with all the possible adverse reactions/side effects for all the Medicines you take and that have been prescribed to you. Take any new Medicines after you have completely understood and accpet all the possible adverse reactions/side effects.   Do not drive, operating heavy machinery, perform activities at heights, swimming or participation in water activities or provide baby sitting services if your were admitted for syncope or siezures until you have seen by Primary MD or a Neurologist and advised to do so again.  Do not drive when taking Pain medications.    Do not take more than prescribed Pain, Sleep and Anxiety Medications  Special Instructions: If you have smoked or chewed Tobacco  in the last 2 yrs please stop smoking, stop any regular Alcohol  and or any Recreational drug use.  Wear Seat belts while driving.   Please note  You were cared for by a hospitalist during your hospital stay. If you have any questions about your discharge medications or the care you received while you were in the hospital  after you are discharged, you can call the unit and asked to speak with the hospitalist on call if the hospitalist that took care of you is not available. Once you are discharged, your primary care physician will handle any further medical issues. Please note that NO REFILLS for any discharge medications will be authorized once you are discharged, as it is imperative that you return to your primary care physician (or establish a relationship with a primary care physician if you do not have one) for your aftercare needs so that they can reassess your need for medications and monitor your lab values.

## 2023-12-05 NOTE — Plan of Care (Signed)
 progressing

## 2023-12-05 NOTE — Evaluation (Signed)
 Physical Therapy Evaluation Patient Details Name: Holly Spencer MRN: [id number] DOB: 10/27/1960 Today's Date: 12/05/2023  History of Present Illness  63 y.o. female presenting 4/16 from the wound clinic for generalized weakness shortness of breath and tachycardia. PMHx: of morbid obesity with a BMI of 33.98, diabetes mellitus type 2, essential hypertension, hyperlipidemia, CKD stage IIIb.  Clinical Impression  Pt admitted with above diagnosis. PTA patient active, independent, travels and works. States she has been vomiting since last Sunday and has gotten progressively weak and unsteady. Required min assist for transfer and gait today with hand held support. Retrieved RW for pt to try next visit as this will likely provide adequate support to mobilize. Has 24/7 assist available from her husband who is retired. No focal deficits noted, LE strength 5/5 grossly with MMT. Staggering quite a bit with gait, but no overt buckling present. Educated on safety. Unless symptoms resolve rapidly, will benefit from OPPT follow-up, and most likely a RW at d/c. Will progress as tolerated. Pt currently with functional limitations due to the deficits listed below (see PT Problem List). Pt will benefit from acute skilled PT to increase their independence and safety with mobility to allow discharge.           If plan is discharge home, recommend the following: A little help with walking and/or transfers;A little help with bathing/dressing/bathroom;Assistance with cooking/housework;Assist for transportation;Help with stairs or ramp for entrance   Can travel by private vehicle        Equipment Recommendations Rolling walker (2 wheels)  Recommendations for Other Services       Functional Status Assessment Patient has had a recent decline in their functional status and demonstrates the ability to make significant improvements in function in a reasonable and predictable amount of time.     Precautions /  Restrictions Precautions Precautions: Fall Restrictions Weight Bearing Restrictions Per Provider Order: No      Mobility  Bed Mobility Overal bed mobility: Modified Independent             General bed mobility comments: extra time, effortful no physical assist needed.    Transfers Overall transfer level: Needs assistance Equipment used: 1 person hand held assist Transfers: Sit to/from Stand Sit to Stand: Min assist, Contact guard assist           General transfer comment: Min assist initially, progressed to CGA with a couple of trials. Holds rail for support. Cues for technique. Hand held support.    Ambulation/Gait Ambulation/Gait assistance: Min assist Gait Distance (Feet): 100 Feet Assistive device: 1 person hand held assist Gait Pattern/deviations: Step-through pattern, Decreased stride length, Staggering left, Staggering right, Drifts right/left, Scissoring Gait velocity: dec Gait velocity interpretation: <1.31 ft/sec, indicative of household ambulator   General Gait Details: Intermittent staggering, min assist with hand held support, reaches for rail in hallway. VSS throughout on room air. No RW available but will benefit from trail next visit.  Stairs            Wheelchair Mobility     Tilt Bed    Modified Rankin (Stroke Patients Only)       Balance Overall balance assessment: Needs assistance Sitting-balance support: No upper extremity supported, Feet supported Sitting balance-Leahy Scale: Fair     Standing balance support: No upper extremity supported Standing balance-Leahy Scale: Fair Standing balance comment: CGA without UE support, increased sway  Pertinent Vitals/Pain Pain Assessment Pain Assessment: No/denies pain    Home Living Family/patient expects to be discharged to:: Private residence Living Arrangements: Spouse/significant other Available Help at Discharge: Family;Available 24  hours/day Type of Home: House Home Access: Stairs to enter Entrance Stairs-Rails: Right;Left;Can reach both Entrance Stairs-Number of Steps: 7 Alternate Level Stairs-Number of Steps: 8 Home Layout: Bed/bath upstairs;Multi-level Home Equipment: Grab bars - tub/shower      Prior Function Prior Level of Function : Independent/Modified Independent;Driving;Working/employed             Mobility Comments: ind no AD, denies falls ADLs Comments: Ind, 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     Extremity/Trunk Assessment   Upper Extremity Assessment Upper Extremity Assessment: Defer to OT evaluation    Lower Extremity Assessment Lower Extremity Assessment: Overall WFL for tasks assessed (strength grossly 5/5 no focal deficits)       Communication   Communication Communication: No apparent difficulties    Cognition Arousal: Alert Behavior During Therapy: WFL for tasks assessed/performed   PT - Cognitive impairments: No apparent impairments                         Following commands: Intact       Cueing Cueing Techniques: Verbal cues     General Comments General comments (skin integrity, edema, etc.): At rest: SpO2 91%, HR 75, BP 159/88    Exercises     Assessment/Plan    PT Assessment Patient needs continued PT services  PT Problem List Decreased balance;Decreased mobility;Decreased activity tolerance;Decreased coordination;Decreased knowledge of use of DME       PT Treatment Interventions Gait training;DME instruction;Stair training;Functional mobility training;Therapeutic activities;Therapeutic exercise;Balance training;Neuromuscular re-education;Patient/family education    PT Goals (Current goals can be found in the Care Plan section)  Acute Rehab PT Goals Patient Stated Goal: Get well, return home, travel, work. PT Goal Formulation: With patient Time For Goal Achievement: 12/19/23 Potential to Achieve Goals: Good    Frequency Min  2X/week     Co-evaluation               AM-PAC PT 6 Clicks Mobility  Outcome Measure Help needed turning from your back to your side while in a flat bed without using bedrails?: None Help needed moving from lying on your back to sitting on the side of a flat bed without using bedrails?: None Help needed moving to and from a bed to a chair (including a wheelchair)?: A Little Help needed standing up from a chair using your arms (e.g., wheelchair or bedside chair)?: A Little Help needed to walk in hospital room?: A Little Help needed climbing 3-5 steps with a railing? : A Little 6 Click Score: 20    End of Session Equipment Utilized During Treatment: Gait belt Activity Tolerance: Patient tolerated treatment well Patient left: in chair;with call bell/phone within reach;with chair alarm set   PT Visit Diagnosis: Unsteadiness on feet (R26.81);Other abnormalities of gait and mobility (R26.89);Other symptoms and signs involving the nervous system (R29.898)    Time: 9159-9090 PT Time Calculation (min) (ACUTE ONLY): 29 min   Charges:   PT Evaluation $PT Eval Low Complexity: 1 Low PT Treatments $Gait Training: 8-22 mins PT General Charges $$ ACUTE PT VISIT: 1 Visit         Leontine Roads, PT, DPT [facility]  Rehabilitation Services Physical Therapist Office: [phone] Website: [email or web address]   Leontine GORMAN Roads 12/05/2023, 10:45 AM

## 2023-12-05 NOTE — Discharge Summary (Signed)
 Physician Discharge Summary  Holly Spencer ZOX:[id number] DOB: 1960-11-24 DOA: 12/03/2023  PCP: Janifer Meigs, FNP  Admit date: 12/03/2023 Discharge date: 12/05/2023  Admitted From: (Home) Disposition:  (Home)  Recommendations for Outpatient Follow-up:  Follow up with PCP in 1-2 weeks Please obtain BMP/CBC in one week Patient was started on low-dose statin, please check LFTs in 4 to 6 weeks and monitor L DL and adjust as needed Referral has been sent to cardiology regarding findings of dense coronary artery disease on CT chest Adjust hypoglycemic regimen as needed, Jardiance had been discontinued due to concern for a euglycemic DKA  Home Health: Outpatient PT  Diet recommendation: Heart Healthy / Carb Modified   Brief/Interim Summary: Holly Spencer is a 63 y.o. female with past medical history  of morbid obesity with a BMI of 33.98, diabetes mellitus type 2, essential hypertension, hyperlipidemia, CKD stage IIIb presenting today to the emergency room sent from the wound clinic for generalized weakness shortness of breath and tachycardia.  Patient also reported dizziness along with her shortness of breath and chest pain for the past 2 days.  No reports of dizziness NSAID use bleeding falls nausea vomiting diarrhea.   Sinus tach, resolved Presented from home clinic with sinus tach and some shortness of breath. Resolved after IV fluid hydration with points to possible dehydration as a cause of her sinus tach.  HR now stable in the 70s to 80s.  Echocardiogram within normal limits. Normal TSH and electrolytes. - Started on Coreg , will continue at time of discharge - No significant events on telemetry   T2DM well controlled with A1c of 6.2 Euglycemic DKA Admission labs show early DKA versus euglycemic DKA. Acidosis anion gap resolved with IV fluids.  CBGs stable in the 130s to 150s. -Her A1c came back reassuring at 6.2, and her CBG has been well-controlled during hospital stay. -  Concern for a euglycemic DKA in the setting of Jardiance, so this has been discontinued at time of discharge and I would try to avoid in the future. - Continue with home metformin  and glipizide   hyperlipidemia -Patient is on Zetia, but by reviewing her labs most recent LDL was 117, given she is diabetic she was started on low-dose atorvastatin  20 mg, can uptitrate if she tolerates, she was started on this given some concern of CAD noted on her CT chest, please see discussion below,  CAD/sclerosis -Patient CT chest PE protocol ruled out PE, but did show significant dense multivessel coronary atherosclerosis, with focal mild to moderate stenosis of the proximal left subclavian artery from noncalcified plaque. - Referral has been sent to cardiology, given her significant atherosclerosis, she was started on beta-blockers, statin has been added to her regimen, will start on aspirin  as well.  Secondary polycythemia - Hemoglobin is elevated at 16, this is most likely due to dehydration, as well it can be related to Jardiance which has been discontinued   HTN BP remains persistently elevated with SBP in the 140s to 170s home regimen, so she was started on Coreg , blood pressure has been well-controlled.I   Chronic ulcer of the right calf/venous stasis ulcer - Follow-up for wound care in the outpatient - Continue as needed triamcinolone  cream -Patient has been [facility] closely [facility] and [place], she is having her ABI is pending, and dermatology follow-up pending as well    Generalized weakness - PT, OT consulted, outpatient PT arranged  Discharge Diagnoses:  Principal Problem:   Sinus tachycardia Active Problems:  Diabetes mellitus, type II (HCC)   Hypertension, uncontrolled   CAD (coronary artery disease)    Discharge Instructions  Discharge Instructions     Diet - low sodium heart healthy   Complete by: As directed    Discharge instructions   Complete by: As  directed    Follow with Primary MD Abelardo Abernethy Janet Medico, FNP in 7 days   Get CBC, CMP, checked  by Primary MD next visit.    Activity: As tolerated with Full fall precautions use walker/cane & assistance as needed   Disposition Home    Diet: Heart Healthy /carb modied   On your next visit with your primary care physician please Get Medicines reviewed and adjusted.   Please request your Prim.MD to go over all Hospital Tests and Procedure/Radiological results at the follow up, please get all Hospital records sent to your Prim MD by signing hospital release before you go home.   If you experience worsening of your admission symptoms, develop shortness of breath, life threatening emergency, suicidal or homicidal thoughts you must seek medical attention immediately by calling 911 or calling your MD immediately  if symptoms less severe.  You Must read complete instructions/literature along with all the possible adverse reactions/side effects for all the Medicines you take and that have been prescribed to you. Take any new Medicines after you have completely understood and accpet all the possible adverse reactions/side effects.   Do not drive, operating heavy machinery, perform activities at heights, swimming or participation in water activities or provide baby sitting services if your were admitted for syncope or siezures until you have seen by Primary MD or a Neurologist and advised to do so again.  Do not drive when taking Pain medications.    Do not take more than prescribed Pain, Sleep and Anxiety Medications  Special Instructions: If you have smoked or chewed Tobacco  in the last 2 yrs please stop smoking, stop any regular Alcohol  and or any Recreational drug use.  Wear Seat belts while driving.   Please note  You were cared for by a hospitalist during your hospital stay. If you have any questions about your discharge medications or the care you received while you were in the  hospital after you are discharged, you can call the unit and asked to speak with the hospitalist on call if the hospitalist that took care of you is not available. Once you are discharged, your primary care physician will handle any further medical issues. Please note that NO REFILLS for any discharge medications will be authorized once you are discharged, as it is imperative that you return to your primary care physician (or establish a relationship with a primary care physician if you do not have one) for your aftercare needs so that they can reassess your need for medications and monitor your lab values.   Discharge wound care:   Complete by: As directed    Continue with previous home dressing changes   Increase activity slowly   Complete by: As directed       Allergies as of 12/05/2023   No Known Allergies      Medication List     STOP taking these medications    Jardiance 25 MG Tabs tablet Generic drug: empagliflozin       TAKE these medications    amLODipine  10 MG tablet Commonly known as: NORVASC  Take 10 mg by mouth daily.   aspirin  EC 81 MG tablet Take 1 tablet (81 mg total)  by mouth daily.   atorvastatin  20 MG tablet Commonly known as: Lipitor Take 1 tablet (20 mg total) by mouth daily.   blood glucose meter kit and supplies Use as instructed   carvedilol  6.25 MG tablet Commonly known as: COREG  Take 1 tablet (6.25 mg total) by mouth 2 (two) times daily with a meal.   ezetimibe 10 MG tablet Commonly known as: ZETIA Take 10 mg by mouth daily.   Fingerstix Lancets Misc To check blood sugar tid/ 250.00 dx code uncontrolled diabetes   glipiZIDE  5 MG tablet Commonly known as: GLUCOTROL  TAKE 1 TABLET (5 MG TOTAL) 2 (TWO) TIMES DAILY BEFORE A MEAL BY MOUTH.   metFORMIN  750 MG 24 hr tablet Commonly known as: GLUCOPHAGE -XR TAKE 1 TABLET (750 MG TOTAL) DAILY BY MOUTH. What changed: when to take this   triamcinolone  cream 0.1 % Commonly known as:  KENALOG  Apply 1-2 Applications topically daily as needed.   valsartan -hydrochlorothiazide  320-12.5 MG tablet Commonly known as: DIOVAN -HCT TAKE 1 TABLET BY MOUTH EVERY DAY   ZICAM ALLERGY RELIEF NA Place 1 spray into the nose 2 (two) times daily as needed (allergies).               Durable Medical Equipment  (From admission, onward)           Start     Ordered   12/05/23 1301  For home use only DME Walker rolling  Once       Question Answer Comment  Walker: With 5 Inch Wheels   Patient needs a walker to treat with the following condition Balance problems      12/05/23 1301              Discharge Care Instructions  (From admission, onward)           Start     Ordered   12/05/23 0000  Discharge wound care:       Comments: Continue with previous home dressing changes   12/05/23 1554            No Known Allergies  Consultations: none   Procedures/Studies: ECHOCARDIOGRAM COMPLETE Result Date: 12/04/2023    ECHOCARDIOGRAM REPORT   Patient Name:   Holly Spencer Date of Exam: 12/04/2023 Medical Rec #:  [id number]        Height:       64.0 in Accession #:    [id number]       Weight:       198.0 lb Date of Birth:  [birth year]        BSA:          1.948 m Patient Age:    62 years         BP:           164/98 mmHg Patient Gender: F                HR:           67 bpm. Exam Location:  Inpatient Procedure: 2D Echo, Cardiac Doppler and Color Doppler (Both Spectral and Color            Flow Doppler were utilized during procedure). Indications:    Abnormal ECG R94.31 , Sinus Tachycardia  History:        Patient has no prior history of Echocardiogram examinations.                 Risk Factors:Hypertension and Diabetes.  Sonographer:    Hersey Lorenzo RDCS Referring  Phys: Alcide Humble PATEL IMPRESSIONS  1. Left ventricular ejection fraction, by estimation, is 65 to 70%. The left ventricle has normal function. The left ventricle has no regional wall motion abnormalities. Left  ventricular diastolic parameters were normal.  2. Right ventricular systolic function is normal. The right ventricular size is normal.  3. The mitral valve is normal in structure. No evidence of mitral valve regurgitation. No evidence of mitral stenosis.  4. The aortic valve has an indeterminant number of cusps. There is mild calcification of the aortic valve. Aortic valve regurgitation is not visualized. Aortic valve sclerosis is present, with no evidence of aortic valve stenosis.  5. The inferior vena cava is normal in size with greater than 50% respiratory variability, suggesting right atrial pressure of 3 mmHg. FINDINGS  Left Ventricle: Left ventricular ejection fraction, by estimation, is 65 to 70%. The left ventricle has normal function. The left ventricle has no regional wall motion abnormalities. The left ventricular internal cavity size was normal in size. There is  no left ventricular hypertrophy. Left ventricular diastolic parameters were normal. Right Ventricle: The right ventricular size is normal. No increase in right ventricular wall thickness. Right ventricular systolic function is normal. Left [facility]: Left atrial size was normal in size. Right [facility]: Right atrial size was normal in size. Pericardium: There is no evidence of pericardial effusion. Mitral Valve: The mitral valve is normal in structure. No evidence of mitral valve regurgitation. No evidence of mitral valve stenosis. Tricuspid Valve: The tricuspid valve is normal in structure. Tricuspid valve regurgitation is not demonstrated. No evidence of tricuspid stenosis. Aortic Valve: The aortic valve has an indeterminant number of cusps. There is mild calcification of the aortic valve. Aortic valve regurgitation is not visualized. Aortic valve sclerosis is present, with no evidence of aortic valve stenosis. Pulmonic Valve: The pulmonic valve was normal in structure. Pulmonic valve regurgitation is not visualized. No evidence of pulmonic  stenosis. Aorta: The aortic root is normal in size and structure. Venous: The inferior vena cava is normal in size with greater than 50% respiratory variability, suggesting right atrial pressure of 3 mmHg. IAS/Shunts: No atrial level shunt detected by color flow Doppler.  LEFT VENTRICLE PLAX 2D LVIDd:         4.20 cm   Diastology LVIDs:         2.40 cm   LV e' medial:    5.44 cm/s LV PW:         1.00 cm   LV E/e' medial:  11.2 LV IVS:        1.00 cm   LV e' lateral:   9.36 cm/s LVOT diam:     1.80 cm   LV E/e' lateral: 6.5 LV SV:         52 LV SV Index:   27 LVOT Area:     2.54 cm  RIGHT VENTRICLE             IVC RV S prime:     12.80 cm/s  IVC diam: 1.10 cm TAPSE (M-mode): 1.5 cm LEFT ATRIUM             Index        RIGHT ATRIUM          Index LA diam:        3.50 cm 1.80 cm/m   RA Area:     6.16 cm LA Vol (A2C):   25.6 ml 13.14 ml/m  RA Volume:   8.74 ml  4.49  ml/m LA Vol (A4C):   22.9 ml 11.76 ml/m LA Biplane Vol: 24.2 ml 12.42 ml/m  AORTIC VALVE LVOT Vmax:   113.00 cm/s LVOT Vmean:  65.200 cm/s LVOT VTI:    0.203 m  AORTA Ao Root diam: 2.40 cm Ao Asc diam:  3.10 cm MITRAL VALVE MV Area (PHT): 2.64 cm     SHUNTS MV Decel Time: 287 msec     Systemic VTI:  0.20 m MV E velocity: 61.00 cm/s   Systemic Diam: 1.80 cm MV A velocity: 116.00 cm/s MV E/A ratio:  0.53 Arta Lark Electronically signed by Arta Lark Signature Date/Time: 12/04/2023/2:13:11 PM    Final    DG Tibia/Fibula Right Result Date: 12/03/2023 CLINICAL DATA:  Ulcerative wound anterior mid tibia EXAM: RIGHT TIBIA AND FIBULA - 2 VIEW COMPARISON:  None Available. FINDINGS: Degenerative changes in the right knee with joint space narrowing and spurring. No joint effusion. No acute bony abnormality. Specifically, no fracture, subluxation, or dislocation. No bone destruction, particularly in the mid tibial region. No soft tissue gas or radiopaque foreign body. IMPRESSION: No acute bony abnormality. Electronically Signed   By: Janeece Mechanic  M.D.   On: 12/03/2023 17:10   CT Angio Chest PE W and/or Wo Contrast Result Date: 12/03/2023 CLINICAL DATA:  Pulmonary embolism (PE) suspected, high prob, dizziness, chest pain, dyspnea, tachycardia. EXAM: CT ANGIOGRAPHY CHEST WITH CONTRAST TECHNIQUE: Multidetector CT imaging of the chest was performed using the standard protocol during bolus administration of intravenous contrast. Multiplanar CT image reconstructions and MIPs were obtained to evaluate the vascular anatomy. RADIATION DOSE REDUCTION: This exam was performed according to the departmental dose-optimization program which includes automated exposure control, adjustment of the mA and/or kV according to patient size and/or use of iterative reconstruction technique. CONTRAST:  75mL OMNIPAQUE  IOHEXOL  350 MG/ML SOLN COMPARISON:  December 03, 2023, August 11, 2015 FINDINGS: Pulmonary Embolism: No pulmonary embolism. Apparent filling defect within a segmental branch in the anterior right upper lobe (axial 53), likely artifactual due to the dense contrast column in the SVC. Cardiovascular: No cardiomegaly or pericardial effusion. No aortic aneurysm. Dense multi-vessel coronary atherosclerosis. Focal mild-to-moderate stenosis of the proximal left subclavian artery from noncalcified plaque (axial 26). Normal variant 2 vessel aortic arch anatomy. Scattered aortic atherosclerosis. Mediastinum/Nodes: No mediastinal mass. No mediastinal, hilar, or axillary lymphadenopathy. Lungs/Pleura: The midline trachea and bronchi are patent. No focal airspace consolidation, pleural effusion, or pneumothorax. Posterior bibasilar dependent atelectasis. 2 mm micronodule abutting the horizontal fissure, likely an intrafissural lymph node. Musculoskeletal: No acute fracture or destructive bone lesion. Multilevel degenerative disc disease of the spine. 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 Bony calculus surrounding the proximal right humeral neck. Upper Abdomen: No acute abnormality in the partially visualized upper abdomen. Nodular thickening of both adrenal glands. Review of the MIP images confirms the above findings. IMPRESSION: No acute intrathoracic abnormality; specifically, no pulmonary embolism, pneumonia, or pleural effusion. Aortic Atherosclerosis (ICD10-I70.0). Electronically Signed   By: Rance Burrows M.D.   On: 12/03/2023 13:55   DG Chest 2 View Result Date: 12/03/2023 CLINICAL DATA:  Chest pain, shortness of breath, dizziness EXAM: CHEST - 2 VIEW COMPARISON:  08/11/2015 FINDINGS: Cardiomediastinal silhouette and pulmonary vasculature are within normal limits. Lungs are clear. IMPRESSION: No acute cardiopulmonary process. Electronically Signed   By: Elester Grim M.D.   On: 12/03/2023 11:50      Subjective: Denies  any complaints today, no nausea, no vomiting, ambulated multiple times in the hallway with no tachycardia or any symptoms.  Discharge Exam: Vitals:   12/05/23 0500 12/05/23 0600  BP: 136/81 127/72  Pulse: 72 79  Resp:    Temp:    SpO2: 92% 90%   Vitals:   12/04/23 2348 12/05/23 0418 12/05/23 0500 12/05/23 0600  BP: (!) 162/81 (!) 97/55 136/81 127/72  Pulse: 72 94 72 79  Resp:      Temp: 97.7 F (36.5 C) 98.2 F (36.8 C)    TempSrc: Oral Oral    SpO2: 92% 95% 92% 90%  Weight:      Height:        General: Pt is alert, awake, not in acute distress Cardiovascular: RRR, S1/S2 +, no rubs, no gallops Respiratory: CTA bilaterally, no wheezing, no rhonchi Abdominal: Soft, NT, ND, bowel sounds + Extremities: no edema, no cyanosis, right lower extremity venous stasis wound, please see picture below         The results of significant diagnostics from this hospitalization (including imaging, microbiology, ancillary and laboratory) are listed below for reference.     Microbiology: Recent Results (from the past 240 hours)   Resp panel by RT-PCR (RSV, Flu A&B, Covid) Anterior Nasal Swab     Status: None   Collection Time: 12/03/23 11:54 AM   Specimen: Anterior Nasal Swab  Result Value Ref Range Status   SARS Coronavirus 2 by RT PCR NEGATIVE NEGATIVE Final   Influenza A by PCR NEGATIVE NEGATIVE Final   Influenza B by PCR NEGATIVE NEGATIVE Final    Comment: (NOTE) The Xpert Xpress SARS-CoV-2/FLU/RSV plus assay is intended as an aid in the diagnosis of influenza from Nasopharyngeal swab specimens and should not be used as a sole basis for treatment. Nasal washings and aspirates are unacceptable for Xpert Xpress SARS-CoV-2/FLU/RSV testing.  Fact Sheet for Patients: BloggerCourse.com  Fact Sheet for Healthcare Providers: SeriousBroker.it  This test is not yet approved or cleared by the [place]  FDA and has been authorized for detection and/or diagnosis of SARS-CoV-2 by FDA under an Emergency Use Authorization (EUA). This EUA will remain in effect (meaning this test can be used) for the duration of the COVID-19 declaration under Section 564(b)(1) of the Act, 21 U.S.C. section 360bbb-3(b)(1), unless the authorization is terminated or revoked.     Resp Syncytial Virus by PCR NEGATIVE NEGATIVE Final    Comment: (NOTE) Fact Sheet for Patients: BloggerCourse.com  Fact Sheet for Healthcare Providers: SeriousBroker.it  This test is not yet approved or cleared by the [place]  FDA and has been authorized for detection and/or diagnosis of SARS-CoV-2 by FDA under an Emergency Use Authorization (EUA). This EUA will remain in effect (meaning this test can be used) for the duration of the COVID-19 declaration under Section 564(b)(1) of the Act, 21 U.S.C. section 360bbb-3(b)(1), unless the authorization is terminated or revoked.  Performed at [facility], [address], [place],  [place] [address]   Blood culture (routine x 2)     Status: None (Preliminary result)   Collection Time: 12/03/23 12:00 PM   Specimen: BLOOD RIGHT HAND  Result Value Ref Range Status   Specimen Description BLOOD RIGHT HAND  Final   Special Requests   Final    BOTTLES DRAWN AEROBIC AND ANAEROBIC Blood Culture adequate volume   Culture   Final    NO GROWTH 2 DAYS Performed at [facility], [address] [place], [place]  [address]    Report Status PENDING  Incomplete  Blood culture (routine x 2)     Status: None (Preliminary result)   Collection Time: 12/03/23 12:05 PM   Specimen: BLOOD LEFT ARM  Result Value Ref Range Status   Specimen Description BLOOD LEFT ARM  Final   Special Requests   Final    BOTTLES DRAWN AEROBIC AND ANAEROBIC Blood Culture adequate volume   Culture   Final    NO GROWTH 2 DAYS Performed at [facility], [address] [place], [place] [address]    Report Status PENDING  Incomplete     Labs: BNP (last 3 results) Recent Labs    12/03/23 1827  BNP 114.6*   Basic Metabolic Panel: Recent Labs  Lab 12/03/23 1023 12/03/23 1033 12/03/23 1827 12/04/23 1304 12/05/23 0421  NA 138 138  --  134* 134*  K 3.6 3.6  --  4.0 3.6  CL 99 105  --  93* 95*  CO2 21*  --   --  26 22  GLUCOSE 194* 197*  --  122* 118*  BUN 35* 38*  --  33* 37*  CREATININE 1.41* 1.50*  --  1.06* 1.16*  CALCIUM  10.9*  --   --  10.0 9.1  MG  --   --  2.5*  --   --    Liver Function Tests: Recent Labs  Lab 12/03/23 1023 12/04/23 1304  AST 22 24  ALT 26 22  ALKPHOS 108 99  BILITOT 0.9 1.2  PROT 8.7* 8.0  ALBUMIN 4.6 4.0   No results for input(s): "LIPASE", "AMYLASE" in the last 168 hours. No results for input(s): "AMMONIA" in the last 168 hours. CBC: Recent Labs  Lab 12/03/23 1023 12/03/23 1033 12/04/23 1304 12/05/23 0421  WBC 16.8*  --  15.5* 11.6*  HGB 16.5* 16.0* 16.2* 16.1*  HCT 49.4* 47.0* 48.5* 48.9*  MCV 92.9  --  91.3 93.1  PLT 313  --  291 283    Cardiac Enzymes: No results for input(s): "CKTOTAL", "CKMB", "CKMBINDEX", "TROPONINI" in the last 168 hours. BNP: Invalid input(s): "POCBNP" CBG: Recent Labs  Lab 12/04/23 1429 12/04/23 1726 12/04/23 2019 12/05/23 0754 12/05/23 1212  GLUCAP 153* 130* 146* 152* 143*   D-Dimer No results for input(s): "DDIMER" in the last 72 hours. Hgb A1c Recent Labs    12/03/23 1827  HGBA1C 6.2*   Lipid Profile No results for input(s): "CHOL", "HDL", "LDLCALC", "TRIG", "CHOLHDL", "LDLDIRECT" in the last 72 hours. Thyroid  function studies Recent Labs    12/03/23 1827  TSH 0.486   Anemia work up No results for input(s): "VITAMINB12", "FOLATE", "FERRITIN", "TIBC", "IRON", "RETICCTPCT" in the last 72 hours. Urinalysis    Component Value Date/Time   COLORURINE AMBER (A) 03/04/2016 1220   APPEARANCEUR TURBID (A) 03/04/2016 1220   LABSPEC 1.028 03/04/2016 1220   PHURINE 5.0 03/04/2016 1220   GLUCOSEU 250 (A) 03/04/2016 1220   HGBUR MODERATE (A) 03/04/2016 1220   BILIRUBINUR SMALL (A) 03/04/2016 1220   BILIRUBINUR small (A) 03/04/2016 1033   BILIRUBINUR negative 05/22/2012 1456   KETONESUR 15 (A) 03/04/2016 1220   KETONESUR small (15) (A) 03/04/2016 1033   PROTEINUR 100 (A) 03/04/2016 1220   PROTEINUR >=300 (A) 03/04/2016 1033   UROBILINOGEN 0.2 03/04/2016 1033   UROBILINOGEN 0.2 07/30/2014 1136   NITRITE NEGATIVE 03/04/2016 1220   NITRITE Negative 03/04/2016 1033   LEUKOCYTESUR SMALL (A) 03/04/2016 1220   Sepsis Labs Recent Labs  Lab 12/03/23 1023  12/04/23 1304 12/05/23 0421  WBC 16.8* 15.5* 11.6*   Microbiology Recent Results (from the past 240 hours)  Resp panel by RT-PCR (RSV, Flu A&B, Covid) Anterior Nasal Swab     Status: None   Collection Time: 12/03/23 11:54 AM   Specimen: Anterior Nasal Swab  Result Value Ref Range Status   SARS Coronavirus 2 by RT PCR NEGATIVE NEGATIVE Final   Influenza A by PCR NEGATIVE NEGATIVE Final   Influenza B by PCR NEGATIVE NEGATIVE  Final    Comment: (NOTE) The Xpert Xpress SARS-CoV-2/FLU/RSV plus assay is intended as an aid in the diagnosis of influenza from Nasopharyngeal swab specimens and should not be used as a sole basis for treatment. Nasal washings and aspirates are unacceptable for Xpert Xpress SARS-CoV-2/FLU/RSV testing.  Fact Sheet for Patients: BloggerCourse.com  Fact Sheet for Healthcare Providers: SeriousBroker.it  This test is not yet approved or cleared by the [place]  FDA and has been authorized for detection and/or diagnosis of SARS-CoV-2 by FDA under an Emergency Use Authorization (EUA). This EUA will remain in effect (meaning this test can be used) for the duration of the COVID-19 declaration under Section 564(b)(1) of the Act, 21 U.S.C. section 360bbb-3(b)(1), unless the authorization is terminated or revoked.     Resp Syncytial Virus by PCR NEGATIVE NEGATIVE Final    Comment: (NOTE) Fact Sheet for Patients: BloggerCourse.com  Fact Sheet for Healthcare Providers: SeriousBroker.it  This test is not yet approved or cleared by the [place]  FDA and has been authorized for detection and/or diagnosis of SARS-CoV-2 by FDA under an Emergency Use Authorization (EUA). This EUA will remain in effect (meaning this test can be used) for the duration of the COVID-19 declaration under Section 564(b)(1) of the Act, 21 U.S.C. section 360bbb-3(b)(1), unless the authorization is terminated or revoked.  Performed at [facility], [address] [place], [place] [address]   Blood culture (routine x 2)     Status: None (Preliminary result)   Collection Time: 12/03/23 12:00 PM   Specimen: BLOOD RIGHT HAND  Result Value Ref Range Status   Specimen Description BLOOD RIGHT HAND  Final   Special Requests   Final    BOTTLES DRAWN AEROBIC AND ANAEROBIC Blood Culture adequate volume   Culture    Final    NO GROWTH 2 DAYS Performed at [facility], [address] [place], [place] [address]    Report Status PENDING  Incomplete  Blood culture (routine x 2)     Status: None (Preliminary result)   Collection Time: 12/03/23 12:05 PM   Specimen: BLOOD LEFT ARM  Result Value Ref Range Status   Specimen Description BLOOD LEFT ARM  Final   Special Requests   Final    BOTTLES DRAWN AEROBIC AND ANAEROBIC Blood Culture adequate volume   Culture   Final    NO GROWTH 2 DAYS Performed at [facility], [address] [place], [place] [address]    Report Status PENDING  Incomplete     Time coordinating discharge: Over 30 minutes  SIGNED:   Seena Dadds, MD  [facility] 12/05/2023, 4:06 PM Pager   If 7PM-7AM, please contact night-coverage [email or web address]

## 2023-12-08 LAB — CULTURE, BLOOD (ROUTINE X 2)
Culture: NO GROWTH
Culture: NO GROWTH
Special Requests: ADEQUATE
Special Requests: ADEQUATE

## 2023-12-11 NOTE — Therapy (Deleted)
 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   pt complaining of "knots" in breasts   Diabetes mellitus without complication (HCC)    Hypertension    Past Surgical History:  Procedure Laterality Date   ABDOMINAL HYSTERECTOMY     Patient Active Problem List   Diagnosis Date Noted   CAD (coronary artery disease) 12/05/2023   Sinus tachycardia 12/03/2023   Chest pain, atypical 07/29/2014   Nausea and vomiting 07/29/2014   HTN (hypertension), malignant 07/29/2014   Diabetes mellitus, type II (HCC) 09/21/2011   Hypertension, uncontrolled 09/21/2011    PCP: Janifer Meigs, FNP   REFERRING PROVIDER: Epifanio Haste, MD   REFERRING DIAG: R26.89 (ICD-10-CM) - Balance problems  THERAPY DIAG:  No diagnosis found.  Rationale for Evaluation and Treatment: Rehabilitation  ONSET DATE: 12/03/23  SUBJECTIVE:   SUBJECTIVE STATEMENT: ***  PERTINENT HISTORY: 63 y.o. female presenting 4/16 from the wound clinic for generalized weakness shortness of breath and tachycardia. PMHx: of morbid obesity with a BMI of 33.98, diabetes mellitus type 2, essential hypertension, hyperlipidemia, CKD stage IIIb. PAIN:  Are you having pain? {OPRCPAIN:27236}  PRECAUTIONS: None  RED FLAGS: None   WEIGHT BEARING RESTRICTIONS: No  FALLS:  Has patient fallen in last 6 months? Yes. Number of falls ***  LIVING ENVIRONMENT: Lives with: {OPRC lives with:25569::"lives with their family"} Lives in: {Lives in:25570} Stairs: {opstairs:27293} Has following equipment at home: {Assistive devices:23999}  OCCUPATION: ***  PLOF: Independent  PATIENT GOALS: To get around better  NEXT MD VISIT: TBD  OBJECTIVE:  Note: Objective measures were completed at Evaluation unless otherwise  noted.  DIAGNOSTIC FINDINGS: none  PATIENT SURVEYS:  Patient-specific activity scoring scheme (Point to one number):  "0" represents "unable to perform." "10" represents "able to perform at prior level. 0 1 2 3 4 5 6 7 8 9  10 (Date and Score) Activity Initial  Activity Eval                      Additional Additional Total score = sum of the activity scores/number of activities Minimum detectable change (90%CI) for average score = 2 points Minimum detectable change (90%CI) for single activity score = 3 points PSFS developed by: [place] Spitz., & Binkley, J. (1995). Assessing disability and change on individual  patients: a report of a patient specific measure. Physiotherapy [place], 47, [phone]. Reproduced with the permission of the authors  Score:     MUSCLE LENGTH: Hamstrings: Right *** deg; Left *** deg Andy Bannister test: Right *** deg; Left *** deg  POSTURE: {posture:25561}  PALPATION: deferred  LOWER EXTREMITY ROM:  {AROM/PROM:27142} ROM Right eval Left eval  Hip flexion    Hip extension    Hip abduction    Hip adduction    Hip internal rotation    Hip external rotation    Knee flexion    Knee extension    Ankle dorsiflexion    Ankle plantarflexion    Ankle inversion    Ankle eversion     (Blank rows = not tested)  LOWER EXTREMITY MMT:  MMT Right eval Left eval  Hip flexion    Hip extension    Hip abduction    Hip adduction    Hip internal rotation    Hip external rotation  Knee flexion    Knee extension    Ankle dorsiflexion    Ankle plantarflexion    Ankle inversion    Ankle eversion     (Blank rows = not tested)  LOWER EXTREMITY SPECIAL TESTS:  Not applicable  FUNCTIONAL TESTS:  30 seconds chair stand test  GAIT: Distance walked: *** Assistive device utilized: {Assistive devices:23999} Level of assistance: {Levels of assistance:24026} Comments: ***                                                                                                                                 TREATMENT DATE: ***    PATIENT EDUCATION:  Education details: Discussed eval findings, rehab rationale and POC and patient is in agreement  Person educated: Patient Education method: Explanation Education comprehension: verbalized understanding and needs further education  HOME EXERCISE PROGRAM: ***  ASSESSMENT:  CLINICAL IMPRESSION: Patient is a *** y.o. *** who was seen today for physical therapy evaluation and treatment for ***.   OBJECTIVE IMPAIRMENTS: {opptimpairments:25111}.   ACTIVITY LIMITATIONS: {activitylimitations:27494}  PARTICIPATION LIMITATIONS: {participationrestrictions:25113}  PERSONAL FACTORS: {Personal factors:25162} are also affecting patient's functional outcome.   REHAB POTENTIAL: Good  CLINICAL DECISION MAKING: Stable/uncomplicated  EVALUATION COMPLEXITY: Low   GOALS: Goals reviewed with patient? No  SHORT TERM GOALS: Target date: *** Patient to demonstrate independence in HEP  Baseline: Goal status: INITIAL  2.  Assess 2 MWT Baseline:  Goal status: INITIAL  3.  *** Baseline:  Goal status: INITIAL  4.  *** Baseline:  Goal status: INITIAL  5.  *** Baseline:  Goal status: INITIAL  6.  *** Baseline:  Goal status: INITIAL  LONG TERM GOALS: Target date: ***  Patient will score at least ***% on FOTO to signify clinically meaningful improvement in functional abilities.   Baseline:  Goal status: INITIAL  2.  Patient will increase 30s chair stand reps from *** to *** with/without arms to demonstrate and improved functional ability with less pain/difficulty as well as reduce fall risk.  Baseline:  Goal status: INITIAL  3.  Assess progress with 2 MWT Baseline:  Goal status: INITIAL  4.  *** Baseline:  Goal status: INITIAL  5.  *** Baseline:  Goal status: INITIAL  6.  *** Baseline:  Goal status: INITIAL   PLAN:  PT FREQUENCY: 2x/week  PT  DURATION: 6 weeks  PLANNED INTERVENTIONS: 97164- PT Re-evaluation, 97110-Therapeutic exercises, 97530- Therapeutic activity, 97112- Neuromuscular re-education, 97535- Self Care, 16109- Manual therapy, [phone]- Gait training, Patient/Family education, Balance training, and Stair training  PLAN FOR NEXT SESSION: HEP review and update, manual techniques as appropriate, aerobic tasks, ROM and flexibility activities, strengthening and PREs, TPDN, gait and balance training as needed     Eldon Greenland, PT 12/11/2023, 1:47 PM

## 2023-12-12 ENCOUNTER — Ambulatory Visit: Attending: Internal Medicine

## 2023-12-31 ENCOUNTER — Ambulatory Visit: Attending: Cardiovascular Disease | Admitting: Cardiovascular Disease

## 2024-01-01 ENCOUNTER — Encounter: Payer: Self-pay | Admitting: Cardiovascular Disease
